# Patient Record
Sex: Female | Born: 1970 | Race: White | Hispanic: No | Smoking: Current every day smoker
Health system: Southern US, Community
[De-identification: ages and names within clinical notes are randomized; demographics above are authoritative.]

## PROBLEM LIST (undated history)

## (undated) DIAGNOSIS — T783XXA Angioneurotic edema, initial encounter: Secondary | ICD-10-CM

## (undated) DIAGNOSIS — K219 Gastro-esophageal reflux disease without esophagitis: Secondary | ICD-10-CM

## (undated) DIAGNOSIS — G43909 Migraine, unspecified, not intractable, without status migrainosus: Secondary | ICD-10-CM

## (undated) DIAGNOSIS — I5181 Takotsubo syndrome: Secondary | ICD-10-CM

## (undated) HISTORY — DX: Angioneurotic edema, initial encounter: T78.3XXA

## (undated) HISTORY — PX: APPENDECTOMY: SHX54

## (undated) HISTORY — DX: Migraine, unspecified, not intractable, without status migrainosus: G43.909

## (undated) HISTORY — DX: Takotsubo syndrome: I51.81

## (undated) HISTORY — PX: TUBAL LIGATION: SHX77

## (undated) HISTORY — DX: Gastro-esophageal reflux disease without esophagitis: K21.9

---
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
## 2011-07-18 ENCOUNTER — Encounter: Payer: Self-pay | Admitting: Urgent Care

## 2011-07-18 ENCOUNTER — Ambulatory Visit (INDEPENDENT_AMBULATORY_CARE_PROVIDER_SITE_OTHER): Payer: BC Managed Care – PPO | Admitting: Urgent Care

## 2011-07-18 VITALS — BP 132/79 | HR 71 | Temp 98.3°F | Ht 68.0 in | Wt 177.0 lb

## 2011-07-18 DIAGNOSIS — K5909 Other constipation: Secondary | ICD-10-CM | POA: Insufficient documentation

## 2011-07-18 DIAGNOSIS — K219 Gastro-esophageal reflux disease without esophagitis: Secondary | ICD-10-CM | POA: Insufficient documentation

## 2011-07-18 DIAGNOSIS — K5900 Constipation, unspecified: Secondary | ICD-10-CM

## 2011-07-18 LAB — COMPREHENSIVE METABOLIC PANEL
ALT: 12 U/L (ref 7–35)
Alkaline Phosphatase: 88 U/L
Calcium: 9.8 mg/dL
Chloride: 100 mmol/L
Potassium: 4 mmol/L
Sodium: 135 mmol/L — AB (ref 137–147)
Total Bilirubin: 0.4 mg/dL

## 2011-07-18 LAB — CBC WITH DIFFERENTIAL/PLATELET
HCT: 39 %
platelet count: 355

## 2011-07-18 LAB — LIPID PANEL: Cholesterol, Total: 218

## 2011-07-18 NOTE — Patient Instructions (Addendum)
Return the stool specimen to our office as soon as possible Miralax 17 grams daily as needed for constipation Prilosec 20mg  daily 30 minutes before breakfast for acid reflux Get a copy of your recent labs for me to review Call me with a progress report in 10-14 days Constipation in Adults Constipation is having fewer than 2 bowel movements per week. Usually, the stools are hard. As we grow older, constipation is more common. If you try to fix constipation with laxatives, the problem may get worse. This is because laxatives taken over a long period of time make the colon muscles weaker. A low-fiber diet, not taking in enough fluids, and taking some medicines may make these problems worse. MEDICATIONS THAT MAY CAUSE CONSTIPATION  Water pills (diuretics).   Calcium channel blockers (used to control blood pressure and for the heart).   Certain pain medicines (narcotics).   Anticholinergics.   Anti-inflammatory agents.   Antacids that contain aluminum.  DISEASES THAT CONTRIBUTE TO CONSTIPATION  Diabetes.   Parkinson's disease.   Dementia.   Stroke.   Depression.   Illnesses that cause problems with salt and water metabolism.  HOME CARE INSTRUCTIONS   Constipation is usually best cared for without medicines. Increasing dietary fiber and eating more fruits and vegetables is the best way to manage constipation.   Slowly increase fiber intake to 25 to 38 grams per day. Whole grains, fruits, vegetables, and legumes are good sources of fiber. A dietitian can further help you incorporate high-fiber foods into your diet.   Drink enough water and fluids to keep your urine clear or pale yellow.   A fiber supplement may be added to your diet if you cannot get enough fiber from foods.   Increasing your activities also helps improve regularity.   Suppositories, as suggested by your caregiver, will also help. If you are using antacids, such as aluminum or calcium containing products, it  will be helpful to switch to products containing magnesium if your caregiver says it is okay.   If you have been given a liquid injection (enema) today, this is only a temporary measure. It should not be relied on for treatment of longstanding (chronic) constipation.   Stronger measures, such as magnesium sulfate, should be avoided if possible. This may cause uncontrollable diarrhea. Using magnesium sulfate may not allow you time to make it to the bathroom.  SEEK IMMEDIATE MEDICAL CARE IF:   There is bright red blood in the stool.   The constipation stays for more than 4 days.   There is belly (abdominal) or rectal pain.   You do not seem to be getting better.   You have any questions or concerns.  MAKE SURE YOU:   Understand these instructions.   Will watch your condition.   Will get help right away if you are not doing well or get worse.  Document Released: 01/13/2004 Document Revised: 04/05/2011 Document Reviewed: 03/20/2011 [facility] Patient Information 2012 [place], [place]. Gastroesophageal Reflux Disease, Adult Gastroesophageal reflux disease (GERD) happens when acid from your stomach flows up into the esophagus. When acid comes in contact with the esophagus, the acid causes soreness (inflammation) in the esophagus. Over time, GERD may create small holes (ulcers) in the lining of the esophagus. CAUSES   Increased body weight. This puts pressure on the stomach, making acid rise from the stomach into the esophagus.   Smoking. This increases acid production in the stomach.   Drinking alcohol. This causes decreased pressure in the lower esophageal sphincter (  valve or ring of muscle between the esophagus and stomach), allowing acid from the stomach into the esophagus.   Late evening meals and a full stomach. This increases pressure and acid production in the stomach.   A malformed lower esophageal sphincter.  Sometimes, no cause is found. SYMPTOMS   Burning pain in the lower  part of the mid-chest behind the breastbone and in the mid-stomach area. This may occur twice a week or more often.   Trouble swallowing.   Sore throat.   Dry cough.   Asthma-like symptoms including chest tightness, shortness of breath, or wheezing.  DIAGNOSIS  Your caregiver may be able to diagnose GERD based on your symptoms. In some cases, X-rays and other tests may be done to check for complications or to check the condition of your stomach and esophagus. TREATMENT  Your caregiver may recommend over-the-counter or prescription medicines to help decrease acid production. Ask your caregiver before starting or adding any new medicines.  HOME CARE INSTRUCTIONS   Change the factors that you can control. Ask your caregiver for guidance concerning weight loss, quitting smoking, and alcohol consumption.   Avoid foods and drinks that make your symptoms worse, such as:   Caffeine or alcoholic drinks.   Chocolate.   Peppermint or mint flavorings.   Garlic and onions.   Spicy foods.   Citrus fruits, such as oranges, lemons, or limes.   Tomato-based foods such as sauce, chili, salsa, and pizza.   Fried and fatty foods.   Avoid lying down for the 3 hours prior to your bedtime or prior to taking a nap.   Eat small, frequent meals instead of large meals.   Wear loose-fitting clothing. Do not wear anything tight around your waist that causes pressure on your stomach.   Raise the head of your bed 6 to 8 inches with wood blocks to help you sleep. Extra pillows will not help.   Only take over-the-counter or prescription medicines for pain, discomfort, or fever as directed by your caregiver.   Do not take aspirin, ibuprofen, or other nonsteroidal anti-inflammatory drugs (NSAIDs).  SEEK IMMEDIATE MEDICAL CARE IF:   You have pain in your arms, neck, jaw, teeth, or back.   Your pain increases or changes in intensity or duration.   You develop nausea, vomiting, or sweating  (diaphoresis).   You develop shortness of breath, or you faint.   Your vomit is green, yellow, black, or looks like coffee grounds or blood.   Your stool is red, bloody, or black.  These symptoms could be signs of other problems, such as heart disease, gastric bleeding, or esophageal bleeding. MAKE SURE YOU:   Understand these instructions.   Will watch your condition.   Will get help right away if you are not doing well or get worse.  Document Released: 01/24/2005 Document Revised: 04/05/2011 Document Reviewed: 11/03/2010 [facility] Patient Information 2012 [place], [place].

## 2011-07-18 NOTE — Assessment & Plan Note (Addendum)
Lifelong chronic constipation.  No alarm features.  iFOBT Miralax 17 grams daily as needed for constipation Constipation literature

## 2011-07-18 NOTE — Progress Notes (Signed)
Primary Care Physician:  Dolores Hoose, OTR Primary Gastroenterologist:  Dr. Jena Gauss  Chief Complaint  Patient presents with  . Gastrophageal Reflux    HPI:  Carrie Jordan is a 41 y.o. female here as a new patient for evaluation of GERD for 5 yrs.  Started.w/ last pregnancy.  She has been having Daily heartburn.  Tried nexium 40mg  daily years ago.  Her heartburn is worse w/ lying down.  Taking TUMS, otc meds as needed, but not on a daily PPI. She also has chronic constipation and can go to a week to 2 weeks without a bowel movement.  She has been using milk of magnesia couple of days a week to help. She denies any rectal bleeding or melena. She denies any anorexia or weight loss. She denies any dysphagia or odynophagia.  She has never had an EGD or colonoscopy.  Past Medical History  Diagnosis Date  . GERD (gastroesophageal reflux disease)     Past Surgical History  Procedure Date  . Tubal ligation     No current outpatient prescriptions on file.    Allergies as of 07/18/2011  . (No Known Allergies)    Family History:There is no known family history of colorectal carcinoma , liver disease, or inflammatory bowel disease in first degree relatives.   Problem Relation Age of Onset  . Colon cancer Maternal Grandmother 60  . Cancer Paternal Grandmother     ?blood  . Thyroid disease Mother     History   Social History  . Marital Status: Married    Spouse Name: N/A    Number of Children: 2  . Years of Education: N/A   Occupational History  . cafeteria school    Social History Main Topics  . Smoking status: Former Smoker -- 1.0 packs/day    Types: Cigarettes    Quit date: 03/01/2011  . Smokeless tobacco: Former Neurosurgeon    Quit date: 03/20/2011  . Alcohol Use: No  . Drug Use: No  . Sexually Active: Not on file   Other Topics Concern  . Not on file   Social History Narrative   Lives w/ husband & 2 daughters 5&17    Review of Systems: Gen: Denies any  fever, chills, sweats, anorexia, fatigue, weakness, malaise, weight loss, and sleep disorder CV: Denies chest pain, angina, palpitations, syncope, orthopnea, PND, peripheral edema, and claudication. Resp: Denies dyspnea at rest, dyspnea with exercise, cough, sputum, wheezing, coughing up blood, and pleurisy. GI: Denies vomiting blood, jaundice, and fecal incontinence.   Denies dysphagia or odynophagia. GU : Denies urinary burning, blood in urine, urinary frequency, urinary hesitancy, nocturnal urination, and urinary incontinence. MS: Denies joint pain, limitation of movement, and swelling, stiffness, low back pain, extremity pain. Denies muscle weakness, cramps, atrophy.  Derm: Denies rash, itching, dry skin, hives, moles, warts, or unhealing ulcers.  Psych: Denies depression, anxiety, memory loss, suicidal ideation, hallucinations, paranoia, and confusion. Heme: Denies bruising, bleeding, and enlarged lymph nodes. Neuro:  Denies any headaches, dizziness, paresthesias. Endo:  Denies any problems with DM, thyroid, adrenal function.  Physical Exam: BP 132/79  Pulse 71  Temp(Src) 98.3 F (36.8 C) (Temporal)  Ht 5\' 8"  (1.727 m)  Wt 177 lb (80.287 kg)  BMI 26.91 kg/m2  LMP 06/27/2011 General:   Alert,  Well-developed, well-nourished, pleasant and cooperative in NAD Head:  Normocephalic and atraumatic. Eyes:  Sclera clear, no icterus.   Conjunctiva pink. Ears:  Normal auditory acuity. Nose:  No deformity, discharge, or lesions. Mouth:  No deformity or lesions,oropharynx pink & moist. Neck:  Supple; no masses or thyromegaly. Lungs:  Clear throughout to auscultation.   No wheezes, crackles, or rhonchi. No acute distress. Heart:  Regular rate and rhythm; no murmurs, clicks, rubs,  or gallops. Abdomen:  Normal bowel sounds.  No bruits.  Soft, non-tender and non-distended without masses, hepatosplenomegaly or hernias noted.  No guarding or rebound tenderness.   Rectal:  Deferred. Msk:   Symmetrical without gross deformities. Normal posture. Pulses:  Normal pulses noted. Extremities:  No clubbing or edema. Neurologic:  Alert and  oriented x4;  grossly normal neurologically. Skin:  Intact without significant lesions or rashes. Lymph Nodes:  No significant cervical adenopathy. Psych:  Alert and cooperative. Normal mood and affect.

## 2011-07-18 NOTE — Assessment & Plan Note (Addendum)
Carrie Jordan is a pleasant 40 y.o. female with 5 year history of chronic GERD. She is currently not taking PPI. No alarm features.  Trial of omeprazole 20 mg daily for 10-14 days If no relief with PPI, she is going to need EGD for further evaluation. She will get a copy of your recent labs for me to review GERD literature

## 2011-07-18 NOTE — Progress Notes (Signed)
Faxed to PCP

## 2011-08-06 ENCOUNTER — Telehealth: Payer: Self-pay | Admitting: Urgent Care

## 2011-08-06 NOTE — Telephone Encounter (Signed)
Please call patient to remind her to return ifobt and get a progress report Thanks

## 2011-08-07 MED ORDER — OMEPRAZOLE 20 MG PO CPDR: 20.0000 mg | DELAYED_RELEASE_CAPSULE | Freq: Every day | ORAL | Status: DC

## 2011-08-07 NOTE — Telephone Encounter (Signed)
Pt has [employer] (medical). They will cover prilosec otc #42 for 5.00. We have a rebate card for 5.00 off the Rx. Pt can get rx for free. Called pt and asked if that is something she would be interested in. Pt said yes and is requesting we [facility]. Advised pt that I would mail rebate card to her.

## 2011-08-07 NOTE — Telephone Encounter (Signed)
Tried to call pt- NA 

## 2011-08-07 NOTE — Telephone Encounter (Signed)
Noted.  Rx sent.

## 2011-08-07 NOTE — Telephone Encounter (Signed)
Called [facility] [place] and changed rx to Prilosec otc. Ok per [employer]

## 2011-08-07 NOTE — Telephone Encounter (Signed)
Pt returned called- she stated she was doing great- prilosec is helping a lot and miralax is making her have more normal bowel movements.  She mailed her ifobt, she said she had to wait until she stopped her menstrual period before she did test.  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

## 2011-08-09 NOTE — Telephone Encounter (Signed)
07/17/2011 labs reviewed CMP normal TSH normal CBC normal Total cholesterol 218

## 2011-08-13 ENCOUNTER — Ambulatory Visit (INDEPENDENT_AMBULATORY_CARE_PROVIDER_SITE_OTHER): Payer: BC Managed Care – PPO | Admitting: Urgent Care

## 2011-08-13 DIAGNOSIS — K5900 Constipation, unspecified: Secondary | ICD-10-CM

## 2011-08-14 ENCOUNTER — Telehealth: Payer: Self-pay

## 2011-08-14 NOTE — Progress Notes (Signed)
Please call patient with her noted there was no blood in her stool.

## 2011-08-14 NOTE — Progress Notes (Signed)
Pt aware.

## 2011-08-14 NOTE — Telephone Encounter (Signed)
Spoke with pt- she is requesting generic [facility] [place]. She said she could get it cheaper that way.

## 2011-08-15 MED ORDER — POLYETHYLENE GLYCOL 3350 17 GM/SCOOP PO POWD: 17.0000 g | Freq: Every day | ORAL | Status: AC

## 2011-12-17 ENCOUNTER — Other Ambulatory Visit: Payer: Self-pay | Admitting: Urgent Care

## 2011-12-17 ENCOUNTER — Other Ambulatory Visit: Payer: Self-pay

## 2012-08-04 ENCOUNTER — Encounter: Payer: Self-pay | Admitting: Family Medicine

## 2012-08-04 ENCOUNTER — Ambulatory Visit (INDEPENDENT_AMBULATORY_CARE_PROVIDER_SITE_OTHER): Payer: BC Managed Care – PPO | Admitting: Family Medicine

## 2012-08-04 VITALS — BP 135/78 | HR 80 | Temp 98.0°F | Ht 68.0 in | Wt 182.8 lb

## 2012-08-04 DIAGNOSIS — J029 Acute pharyngitis, unspecified: Secondary | ICD-10-CM | POA: Insufficient documentation

## 2012-08-04 DIAGNOSIS — L255 Unspecified contact dermatitis due to plants, except food: Secondary | ICD-10-CM | POA: Insufficient documentation

## 2012-08-04 LAB — POCT RAPID STREP A (OFFICE): Rapid Strep A Screen: NEGATIVE

## 2012-08-04 MED ORDER — TRIAMCINOLONE ACETONIDE 0.5 % EX OINT: TOPICAL_OINTMENT | Freq: Two times a day (BID) | CUTANEOUS | Status: DC

## 2012-08-04 MED ORDER — CETIRIZINE HCL 10 MG PO TABS: 10.0000 mg | ORAL_TABLET | Freq: Every day | ORAL | Status: DC

## 2012-08-04 NOTE — Progress Notes (Signed)
Patient ID: Carrie Jordan, female   DOB: [birth year], 42 y.o.   MRN: [id number] SUBJECTIVE:  HPI: Sorethroat last couple of days. Mild congestion. unsure whether it is due to URI , strep or allergies. Works in the school system. Also, planting flowers and cleaning up the yard and got into poison oak. Has a rash breaking out on the forearms, etc.   PMH/PSH: reviewed/updated in Epic  SH/FH: reviewed/updated in Epic  Allergies: reviewed/updated in Epic  Medications: reviewed/updated in Epic  Immunizations: reviewed/updated in Epic   ROS: As above in the HPI. All other systems are stable or negative.   OBJECTIVE: APPEARANCE:  Patient in no acute distress.The patient appeared well nourished and normally developed. Acyanotic.  Waist:  VITAL SIGNS:  SKIN: warm and  Dry without  tattoos and scars. Rash : moist  Red, maculopapular on the forearms. Significant eruption. Looks like a contact dermatitis.  HEAD and Neck: without JVD, throat hyperemic. Mild nasal congestion. No scleral icterus  CHEST & LUNGS: Clear  CVS: Reveals the PMI to be normally located. Regular rhythm, First and Second Heart sounds are normal, and absence of murmurs, rubs or gallops.  ABDOMEN:  Benign,, no organomegaly, no masses, no Abdominal Aortic enlargement. No Guarding , no rebound. No Bruits.  RECTAL:n/a  GU:n/a  EXTREMETIES: nonedematous. Both Femoral and Pedal pulses are normal.  MUSCULOSKELETAL:  Spine: normal Joints: intact  NEUROLOGIC: oriented to time,place and person; nonfocal. Strength is normal Sensory is normal Reflexes are normal Cranial Nerves are normal.    ASSESSMENT: Sore throat - Plan: POCT rapid strep A  Pharyngitis  Contact dermatitis and other eczema due to plants (except food)     PLAN: Orders Placed This Encounter  Procedures  . POCT rapid strep A   Results for orders placed in visit on 08/04/12 (from the past 24 hour(s))  POCT RAPID STREP A (OFFICE)      Status: Normal   Collection Time    08/04/12  4:48 PM      Result Value Range   Rapid Strep A Screen Negative  Negative                                   Meds ordered this encounter  Medications  . cetirizine (ZYRTEC) 10 MG tablet    Sig: Take 1 tablet (10 mg total) by mouth daily.    Dispense:  30 tablet    Refill:  5  . triamcinolone ointment (KENALOG) 0.5 %    Sig: Apply topically 2 (two) times daily.    Dispense:  60 g    Refill:  0   Opted to treat topically instead of with systemic steroids due to the coexisting URI.  Disa Riedlinger P. Modesto Charon, M.D.

## 2013-01-23 ENCOUNTER — Other Ambulatory Visit: Payer: Self-pay | Admitting: Gastroenterology

## 2013-01-23 ENCOUNTER — Other Ambulatory Visit: Payer: Self-pay | Admitting: Family Medicine

## 2013-07-20 ENCOUNTER — Encounter: Payer: Self-pay | Admitting: General Practice

## 2013-07-20 ENCOUNTER — Ambulatory Visit (INDEPENDENT_AMBULATORY_CARE_PROVIDER_SITE_OTHER): Payer: BC Managed Care – PPO | Admitting: General Practice

## 2013-07-20 VITALS — BP 140/77 | HR 83 | Temp 97.2°F | Ht 67.0 in | Wt 187.4 lb

## 2013-07-20 DIAGNOSIS — J309 Allergic rhinitis, unspecified: Secondary | ICD-10-CM

## 2013-07-20 DIAGNOSIS — Z23 Encounter for immunization: Secondary | ICD-10-CM

## 2013-07-20 DIAGNOSIS — K219 Gastro-esophageal reflux disease without esophagitis: Secondary | ICD-10-CM

## 2013-07-20 DIAGNOSIS — Z0000 Encounter for general adult medical examination without abnormal findings: Secondary | ICD-10-CM

## 2013-07-20 MED ORDER — OMEPRAZOLE 20 MG PO CPDR: 20.0000 mg | DELAYED_RELEASE_CAPSULE | Freq: Every day | ORAL | Status: DC

## 2013-07-20 MED ORDER — CETIRIZINE HCL 10 MG PO TABS: 10.0000 mg | ORAL_TABLET | Freq: Every day | ORAL | Status: DC

## 2013-07-20 NOTE — Progress Notes (Signed)
   Subjective:    Patient ID: Carrie Jordan, female    DOB: 1971/04/24, 43 y.o.   MRN: [id number]  HPI Patient presents today for annual exam. Lab work was drawn on 06/30/13 at her place of work. History of gerd and allergic rhinitis. Taking prilosec and zyrtec. Denies regular exercise, but eats healthy.     Review of Systems  Constitutional: Negative for fever and chills.  Respiratory: Negative for chest tightness and shortness of breath.   Cardiovascular: Negative for chest pain and palpitations.  Neurological: Negative for dizziness, weakness and headaches.  All other systems reviewed and are negative.       Objective:   Physical Exam  Constitutional: She is oriented to person, place, and time. She appears well-developed and well-nourished.  HENT:  Head: Normocephalic and atraumatic.  Right Ear: External ear normal.  Left Ear: External ear normal.  Mouth/Throat: Oropharynx is clear and moist.  Eyes: EOM are normal. Pupils are equal, round, and reactive to light.  Neck: Normal range of motion. Neck supple. No thyromegaly present.  Cardiovascular: Normal rate, regular rhythm and normal heart sounds.   Pulmonary/Chest: Effort normal and breath sounds normal. No respiratory distress. She exhibits no tenderness.  Abdominal: Soft. Bowel sounds are normal. She exhibits no distension. There is no tenderness.  Lymphadenopathy:    She has no cervical adenopathy.  Neurological: She is alert and oriented to person, place, and time.  Skin: Skin is warm and dry.  Psychiatric: She has a normal mood and affect.          Assessment & Plan:  1. Annual physical exam   2. Need for diphtheria-tetanus-pertussis (Tdap) vaccine, adult/adolescent  - Tdap vaccine greater than or equal to 7yo IM  3. GERD (gastroesophageal reflux disease)  - omeprazole (PRILOSEC) 20 MG capsule; Take 1 capsule (20 mg total) by mouth daily.  Dispense: 30 capsule; Refill: 11  4. Allergic rhinitis  -  cetirizine (ZYRTEC) 10 MG tablet; Take 1 tablet (10 mg total) by mouth daily.  Dispense: 30 tablet; Refill: 11 -Continue all current medications Labs to be scanned in  Reviewed labs with patient that were drawn at work. Most wnl, cholesterol elevated F/u in 6 months Discussed benefits of regular exercise and healthy eating (low fat, baked foods, increase fiber) Patient verbalized understanding Coralie Keens, FNP-C

## 2013-07-20 NOTE — Patient Instructions (Addendum)
Tetanus, Diphtheria, Pertussis (Tdap) Vaccine What You Need to Know WHY GET VACCINATED? Tetanus, diphtheria and pertussis can be very serious diseases, even for adolescents and adults. Tdap vaccine can protect [place] from these diseases. TETANUS (Lockjaw) causes painful muscle tightening and stiffness, usually all over the body.  It can lead to tightening of muscles in the head and neck so you can't open your mouth, swallow, or sometimes even breathe. Tetanus kills about 1 out of 5 people who are infected. DIPHTHERIA can cause a thick coating to form in the back of the throat.  It can lead to breathing problems, paralysis, heart failure, and death. PERTUSSIS (Whooping Cough) causes severe coughing spells, which can cause difficulty breathing, vomiting and disturbed sleep.  It can also lead to weight loss, incontinence, and rib fractures. Up to 2 in 100 adolescents and 5 in 100 adults with pertussis are hospitalized or have complications, which could include pneumonia and death. These diseases are caused by bacteria. Diphtheria and pertussis are spread from person to person through coughing or sneezing. Tetanus enters the body through cuts, scratches, or wounds. Before vaccines, the [place] saw as many as 200,000 cases a year of diphtheria and pertussis, and hundreds of cases of tetanus. Since vaccination began, tetanus and diphtheria have dropped by about 99% and pertussis by about 80%. TDAP VACCINE Tdap vaccine can protect adolescents and adults from tetanus, diphtheria, and pertussis. One dose of Tdap is routinely given at age 36 or 66. People who did not get Tdap at that age should get it as soon as possible. Tdap is especially important for health care professionals and anyone having close contact with a baby younger than 12 months. Pregnant women should get a dose of Tdap during every pregnancy, to protect the newborn from pertussis. Infants are most at risk for severe, life-threatening  complications from pertussis. A similar vaccine, called Td, protects from tetanus and diphtheria, but not pertussis. A Td booster should be given every 10 years. Tdap may be given as one of these boosters if you have not already gotten a dose. Tdap may also be given after a severe cut or burn to prevent tetanus infection. Your doctor can give you more information. Tdap may safely be given at the same time as other vaccines. SOME PEOPLE SHOULD NOT GET THIS VACCINE  If you ever had a life-threatening allergic reaction after a dose of any tetanus, diphtheria, or pertussis containing vaccine, OR if you have a severe allergy to any part of this vaccine, you should not get Tdap. Tell your doctor if you have any severe allergies.  If you had a coma, or long or multiple seizures within 7 days after a childhood dose of DTP or DTaP, you should not get Tdap, unless a cause other than the vaccine was found. You can still get Td.  Talk to your doctor if you:  have epilepsy or another nervous system problem,  had severe pain or swelling after any vaccine containing diphtheria, tetanus or pertussis,  ever had Guillain-Barr Syndrome (GBS),  aren't feeling well on the day the shot is scheduled. RISKS OF A VACCINE REACTION With any medicine, including vaccines, there is a chance of side effects. These are usually mild and go away on their own, but serious reactions are also possible. Brief fainting spells can follow a vaccination, leading to injuries from falling. Sitting or lying down for about 15 minutes can help prevent these. Tell your doctor if you feel dizzy or light-headed, or  have vision changes or ringing in the ears. Mild problems following Tdap (Did not interfere with activities)  Pain where the shot was given (about 3 in 4 adolescents or 2 in 3 adults)  Redness or swelling where the shot was given (about 1 person in 5)  Mild fever of at least 100.85F (up to about 1 in 25 adolescents or 1 in  100 adults)  Headache (about 3 or 4 people in 10)  Tiredness (about 1 person in 3 or 4)  Nausea, vomiting, diarrhea, stomach ache (up to 1 in 4 adolescents or 1 in 10 adults)  Chills, body aches, sore joints, rash, swollen glands (uncommon) Moderate problems following Tdap (Interfered with activities, but did not require medical attention)  Pain where the shot was given (about 1 in 5 adolescents or 1 in 100 adults)  Redness or swelling where the shot was given (up to about 1 in 16 adolescents or 1 in 25 adults)  Fever over 102F (about 1 in 100 adolescents or 1 in 250 adults)  Headache (about 3 in 20 adolescents or 1 in 10 adults)  Nausea, vomiting, diarrhea, stomach ache (up to 1 or 3 people in 100)  Swelling of the entire arm where the shot was given (up to about 3 in 100). Severe problems following Tdap (Unable to perform usual activities, required medical attention)  Swelling, severe pain, bleeding and redness in the arm where the shot was given (rare). A severe allergic reaction could occur after any vaccine (estimated less than 1 in a million doses). WHAT IF THERE IS A SERIOUS REACTION? What should I look for?  Look for anything that concerns you, such as signs of a severe allergic reaction, very high fever, or behavior changes. Signs of a severe allergic reaction can include hives, swelling of the face and throat, difficulty breathing, a fast heartbeat, dizziness, and weakness. These would start a few minutes to a few hours after the vaccination. What should I do?  If you think it is a severe allergic reaction or other emergency that can't wait, call 9-1-1 or get the person to the nearest hospital. Otherwise, call your doctor.  Afterward, the reaction should be reported to the "Vaccine Adverse Event Reporting System" (VAERS). Your doctor might file this report, or you can do it yourself through the VAERS web site at www.vaers.SamedayNews.es, or by calling 228-541-5957. VAERS is  only for reporting reactions. They do not give medical advice.  THE NATIONAL VACCINE INJURY COMPENSATION PROGRAM The National Vaccine Injury Compensation Program (VICP) is a federal program that was created to compensate people who may have been injured by certain vaccines. Persons who believe they may have been injured by a vaccine can learn about the program and about filing a claim by calling (279) 754-6919 or visiting the [employer] website at GoldCloset.com.ee. HOW CAN I LEARN MORE?  Ask your doctor.  Call your local or state health department.  Contact the Centers for Disease Control and Prevention (CDC):  Call 856-316-9983 or visit CDC's website at http://hunter.com/. CDC Tdap Vaccine VIS (09/06/11) Document Released: 10/16/2011 Document Revised: 08/11/2012 Document Reviewed: 08/06/2012 [facility] Patient Information 2014 [place], [place].   Exercise to Stay Healthy Exercise helps you become and stay healthy. EXERCISE IDEAS AND TIPS Choose exercises that:  You enjoy.  Fit into your day. You do not need to exercise really hard to be healthy. You can do exercises at a slow or medium level and stay healthy. You can:  Stretch before and after working out.  Try yoga, Pilates, or tai chi.  Lift weights.  Walk fast, swim, jog, run, climb stairs, bicycle, dance, or rollerskate.  Take aerobic classes. Exercises that burn about 150 calories:  Running 1  miles in 15 minutes.  Playing volleyball for 45 to 60 minutes.  Washing and waxing a car for 45 to 60 minutes.  Playing touch football for 45 minutes.  Walking 1  miles in 35 minutes.  Pushing a stroller 1  miles in 30 minutes.  Playing basketball for 30 minutes.  Raking leaves for 30 minutes.  Bicycling 5 miles in 30 minutes.  Walking 2 miles in 30 minutes.  Dancing for 30 minutes.  Shoveling snow for 15 minutes.  Swimming laps for 20 minutes.  Walking up stairs for 15 minutes.  Bicycling 4  miles in 15 minutes.  Gardening for 30 to 45 minutes.  Jumping rope for 15 minutes.  Washing windows or floors for 45 to 60 minutes. Document Released: 05/19/2010 Document Revised: 07/09/2011 Document Reviewed: 05/19/2010 [facility] Patient Information 2014 [place], [place].

## 2014-01-25 ENCOUNTER — Encounter: Payer: Self-pay | Admitting: Family

## 2014-01-25 ENCOUNTER — Ambulatory Visit (INDEPENDENT_AMBULATORY_CARE_PROVIDER_SITE_OTHER): Payer: BC Managed Care – PPO | Admitting: Family

## 2014-01-25 VITALS — BP 149/89 | HR 71 | Temp 97.9°F | Ht 67.0 in | Wt 189.8 lb

## 2014-01-25 DIAGNOSIS — J069 Acute upper respiratory infection, unspecified: Secondary | ICD-10-CM

## 2014-01-25 MED ORDER — AZITHROMYCIN 250 MG PO TABS: ORAL_TABLET | ORAL | Status: DC

## 2014-01-25 NOTE — Progress Notes (Signed)
   Subjective:    Patient ID: Carrie Jordan, female    DOB: November 08, 1970, 43 y.o.   MRN: [id number]  HPI Pt presents to the office for a "lump in her throat" that started the last couple of days. Pt denies any pain or fever. PT states that when she swallows she feels like there is "something hanging that wont' go away".    Review of Systems  Constitutional: Negative.  Negative for fever and fatigue.  HENT: Negative.  Negative for ear discharge, ear pain, postnasal drip, rhinorrhea, sinus pressure and sneezing.   Eyes: Negative.   Respiratory: Negative.  Negative for shortness of breath.   Cardiovascular: Negative.  Negative for palpitations.  Gastrointestinal: Negative.   Endocrine: Negative.   Genitourinary: Negative.   Musculoskeletal: Negative.   Neurological: Negative.  Negative for headaches.  Hematological: Negative.   Psychiatric/Behavioral: Negative.   All other systems reviewed and are negative.      Objective:   Physical Exam  Vitals reviewed. Constitutional: She is oriented to person, place, and time. She appears well-developed and well-nourished. No distress.  HENT:  Head: Normocephalic and atraumatic.  Right Ear: External ear normal.  Left Ear: External ear normal.  Nasal passage erythemas with mild swelling Oropharynx erythemas  Eyes: Pupils are equal, round, and reactive to light.  Neck: Normal range of motion. Neck supple. No thyromegaly present.  Cardiovascular: Normal rate, regular rhythm, normal heart sounds and intact distal pulses.   No murmur heard. Pulmonary/Chest: Effort normal and breath sounds normal. No respiratory distress. She has no wheezes.  Abdominal: Soft. Bowel sounds are normal. She exhibits no distension. There is no tenderness.  Musculoskeletal: Normal range of motion. She exhibits no edema and no tenderness.  Neurological: She is alert and oriented to person, place, and time. She has normal reflexes. No cranial nerve deficit.  Skin: Skin  is warm and dry.  Psychiatric: She has a normal mood and affect. Her behavior is normal. Judgment and thought content normal.    BP 149/89  Pulse 71  Temp(Src) 97.9 F (36.6 C) (Oral)  Ht 5\' 7"  (1.702 m)  Wt 189 lb 12.8 oz (86.093 kg)  BMI 29.72 kg/m2       Assessment & Plan:  1. URI (upper respiratory infection) -- Take meds as prescribed - Use a cool mist humidifier  -Use saline nose sprays frequently -Saline irrigations of the nose can be very helpful if done frequently.  * 4X daily for 1 week*  * Use of a nettie pot can be helpful with this. Follow directions with this* -Force fluids -For any cough or congestion  Use plain Mucinex- regular strength or max strength is fine   * Children- consult with Pharmacist for dosing -For fever or aces or pains- take tylenol or ibuprofen appropriate for age and weight.  * for fevers greater than 101 orally you may alternate ibuprofen and tylenol every  3 hours. -Throat lozenges if help - azithromycin (ZITHROMAX) 250 MG tablet; Take 500 mg once then 250 mg for next 4 days  Dispense: 6 each; Refill: 0  Jannifer Rodney, FNP

## 2014-01-25 NOTE — Patient Instructions (Signed)
Upper Respiratory Infection, Adult An upper respiratory infection (URI) is also known as the common cold. It is often caused by a type of germ (virus). Colds are easily spread (contagious). You can pass it to others by kissing, coughing, sneezing, or drinking out of the same glass. Usually, you get better in 1 or 2 weeks.  HOME CARE   Only take medicine as told by your doctor.  Use a warm mist humidifier or breathe in steam from a hot shower.  Drink enough water and fluids to keep your pee (urine) clear or pale yellow.  Get plenty of rest.  Return to work when your temperature is back to normal or as told by your doctor. You may use a face mask and wash your hands to stop your cold from spreading. GET HELP RIGHT AWAY IF:   After the first few days, you feel you are getting worse.  You have questions about your medicine.  You have chills, shortness of breath, or brown or red spit (mucus).  You have yellow or brown snot (nasal discharge) or pain in the face, especially when you bend forward.  You have a fever, puffy (swollen) neck, pain when you swallow, or white spots in the back of your throat.  You have a bad headache, ear pain, sinus pain, or chest pain.  You have a high-pitched whistling sound when you breathe in and out (wheezing).  You have a lasting cough or cough up blood.  You have sore muscles or a stiff neck. MAKE SURE YOU:   Understand these instructions.  Will watch your condition.  Will get help right away if you are not doing well or get worse. Document Released: 10/03/2007 Document Revised: 07/09/2011 Document Reviewed: 07/22/2013 [employer] Patient Information 2015 [employer]. This information is not intended to replace advice given to you by your health care provider. Make sure you discuss any questions you have with your health care provider.  \ - Take meds as prescribed - Use a cool mist humidifier  -Use saline nose sprays frequently -Saline  irrigations of the nose can be very helpful if done frequently.  * 4X daily for 1 week*  * Use of a nettie pot can be helpful with this. Follow directions with this* -Force fluids -For any cough or congestion  Use plain Mucinex- regular strength or max strength is fine   * Children- consult with Pharmacist for dosing -For fever or aces or pains- take tylenol or ibuprofen appropriate for age and weight.  * for fevers greater than 101 orally you may alternate ibuprofen and tylenol every  3 hours. -Throat lozenges if help   Kentrell Hallahan, FNP   

## 2014-02-25 ENCOUNTER — Telehealth: Payer: Self-pay | Admitting: Family

## 2014-02-25 NOTE — Telephone Encounter (Signed)
appt given for tomorrow at 4:45 with Annette Stable

## 2014-02-26 ENCOUNTER — Ambulatory Visit (INDEPENDENT_AMBULATORY_CARE_PROVIDER_SITE_OTHER): Payer: BC Managed Care – PPO | Admitting: Family Medicine

## 2014-02-26 ENCOUNTER — Encounter: Payer: Self-pay | Admitting: Family Medicine

## 2014-02-26 VITALS — BP 150/83 | HR 86 | Temp 97.1°F | Ht 67.0 in | Wt 189.8 lb

## 2014-02-26 DIAGNOSIS — R1013 Epigastric pain: Secondary | ICD-10-CM

## 2014-02-26 DIAGNOSIS — H8113 Benign paroxysmal vertigo, bilateral: Secondary | ICD-10-CM

## 2014-02-26 LAB — POCT CBC
Granulocyte percent: 64.1 %G (ref 37–80)
HCT, POC: 34.1 % — AB (ref 37.7–47.9)
Hemoglobin: 11.4 g/dL — AB (ref 12.2–16.2)
Lymph, poc: 3.2 (ref 0.6–3.4)
MCH, POC: 28.2 pg (ref 27–31.2)
MCHC: 33.4 g/dL (ref 31.8–35.4)
MCV: 84.5 fL (ref 80–97)
MPV: 7.3 fL (ref 0–99.8)
POC Granulocyte: 6.7 (ref 2–6.9)
POC LYMPH PERCENT: 30.4 %L (ref 10–50)
Platelet Count, POC: 325 10*3/uL (ref 142–424)
RBC: 4 M/uL — AB (ref 4.04–5.48)
RDW, POC: 14.8 %
WBC: 10.4 10*3/uL — AB (ref 4.6–10.2)

## 2014-02-26 MED ORDER — OMEPRAZOLE 40 MG PO CPDR: 40.0000 mg | DELAYED_RELEASE_CAPSULE | Freq: Every day | ORAL | Status: DC

## 2014-02-26 MED ORDER — MECLIZINE HCL 25 MG PO TABS: 25.0000 mg | ORAL_TABLET | Freq: Three times a day (TID) | ORAL | Status: DC | PRN

## 2014-02-26 NOTE — Progress Notes (Signed)
   Subjective:    Patient ID: Carrie Jordan, female    DOB: 1970/10/23, 43 y.o.   MRN: [id number]  HPI C/o epigastric abdominal pain for a week.  She denies nausea at present. She also c/o vertigo  Review of Systems  Constitutional: Negative for fever.  HENT: Negative for ear pain.   Eyes: Negative for discharge.  Respiratory: Negative for cough.   Cardiovascular: Negative for chest pain.  Gastrointestinal: Positive for abdominal pain and abdominal distention.  Endocrine: Negative for polyuria.  Genitourinary: Negative for difficulty urinating.  Musculoskeletal: Negative for gait problem and neck pain.  Skin: Negative for color change and rash.  Neurological: Positive for dizziness. Negative for speech difficulty and headaches.  Psychiatric/Behavioral: Negative for agitation.       Objective:    BP 150/83  Pulse 86  Temp(Src) 97.1 F (36.2 C) (Oral)  Ht _0  (1.702 m)  Wt 189 lb 12.8 oz (86.093 kg)  BMI 29.72 kg/m2  LMP 02/04/2014 Physical Exam  Constitutional: She is oriented to person, place, and time. She appears well-developed and well-nourished.  HENT:  Head: Normocephalic and atraumatic.  Mouth/Throat: Oropharynx is clear and moist.  Eyes: Pupils are equal, round, and reactive to light.  Neck: Normal range of motion. Neck supple.  Cardiovascular: Normal rate and regular rhythm.   No murmur heard. Pulmonary/Chest: Effort normal and breath sounds normal.  Abdominal: Soft. Bowel sounds are normal. There is tenderness.  Neurological: She is alert and oriented to person, place, and time.  Skin: Skin is warm and dry.  Psychiatric: She has a normal mood and affect.          Assessment & Plan:     ICD-9-CM ICD-10-CM   1. Epigastric pain 789.06 R10.13 POCT CBC     CMP14+EGFR     Lipase     Amylase  2. Benign paroxysmal positional vertigo, bilateral 386.11 H81.13 meclizine (ANTIVERT) 25 MG tablet   GI cocktail with good results and resolution of pain and  explained that she probably has gastritis and increasing prilosec 74m po qd will help resolve.    Return if symptoms worsen or fail to improve.  WLysbeth PennerFNP

## 2014-02-27 LAB — CMP14+EGFR
ALT: 9 IU/L (ref 0–32)
AST: 14 IU/L (ref 0–40)
Albumin/Globulin Ratio: 1.5 (ref 1.1–2.5)
Albumin: 4.4 g/dL (ref 3.5–5.5)
Alkaline Phosphatase: 91 IU/L (ref 39–117)
BUN/Creatinine Ratio: 17 (ref 9–23)
BUN: 13 mg/dL (ref 6–24)
CO2: 24 mmol/L (ref 18–29)
Calcium: 9.5 mg/dL (ref 8.7–10.2)
Chloride: 100 mmol/L (ref 97–108)
Creatinine, Ser: 0.77 mg/dL (ref 0.57–1.00)
GFR calc Af Amer: 108 mL/min/{1.73_m2} (ref >59)
GFR calc non Af Amer: 94 mL/min/{1.73_m2} (ref >59)
Globulin, Total: 2.9 g/dL (ref 1.5–4.5)
Glucose: 91 mg/dL (ref 65–99)
Potassium: 4.2 mmol/L (ref 3.5–5.2)
Sodium: 139 mmol/L (ref 134–144)
Total Bilirubin: 0.2 mg/dL (ref 0.0–1.2)
Total Protein: 7.3 g/dL (ref 6.0–8.5)

## 2014-02-27 LAB — LIPASE: Lipase: 17 U/L (ref 0–59)

## 2014-02-27 LAB — AMYLASE: Amylase: 44 U/L (ref 31–124)

## 2014-03-01 ENCOUNTER — Telehealth: Payer: Self-pay | Admitting: Family Medicine

## 2014-03-01 NOTE — Telephone Encounter (Signed)
Labs not back yet

## 2014-03-03 ENCOUNTER — Telehealth: Payer: Self-pay | Admitting: Family Medicine

## 2014-03-03 NOTE — Telephone Encounter (Signed)
done

## 2014-03-04 LAB — IRON AND TIBC
Iron Saturation: 11 % — ABNORMAL LOW (ref 15–55)
Iron: 44 ug/dL (ref 35–155)
TIBC: 397 ug/dL (ref 250–450)
UIBC: 353 ug/dL (ref 150–375)

## 2014-03-04 LAB — FERRITIN: Ferritin: 14 ng/mL — ABNORMAL LOW (ref 15–150)

## 2014-03-04 LAB — SPECIMEN STATUS REPORT

## 2014-03-04 LAB — B12 AND FOLATE PANEL
Folate: 12.8 ng/mL (ref >3.0)
Vitamin B-12: 352 pg/mL (ref 211–946)

## 2014-03-05 ENCOUNTER — Other Ambulatory Visit: Payer: Self-pay | Admitting: Family Medicine

## 2014-03-05 MED ORDER — FERROUS SULFATE 325 (65 FE) MG PO TBEC: DELAYED_RELEASE_TABLET | ORAL | Status: DC

## 2014-03-05 NOTE — Telephone Encounter (Signed)
called about labs - aware of anemia   What can she do about the headaches ( she mentioned HA's at visit, but it didn't really get addressed with her other issues) Tried otc- everything- nothing is helping  She is pretty sure these are migraines

## 2014-07-06 ENCOUNTER — Other Ambulatory Visit: Payer: Self-pay | Admitting: Family Medicine

## 2014-08-10 ENCOUNTER — Other Ambulatory Visit: Payer: Self-pay | Admitting: General Practice

## 2014-09-10 ENCOUNTER — Other Ambulatory Visit: Payer: Self-pay | Admitting: Family

## 2014-10-11 ENCOUNTER — Other Ambulatory Visit: Payer: Self-pay | Admitting: Family Medicine

## 2014-10-11 ENCOUNTER — Other Ambulatory Visit: Payer: Self-pay | Admitting: Family

## 2014-10-27 ENCOUNTER — Encounter: Payer: Self-pay | Admitting: Family Medicine

## 2014-10-28 LAB — HM MAMMOGRAPHY: HM Mammogram: NEGATIVE

## 2014-11-15 ENCOUNTER — Other Ambulatory Visit: Payer: Self-pay | Admitting: Family Medicine

## 2015-02-21 ENCOUNTER — Encounter: Payer: Self-pay | Admitting: Pediatrics

## 2015-02-21 ENCOUNTER — Ambulatory Visit (INDEPENDENT_AMBULATORY_CARE_PROVIDER_SITE_OTHER): Payer: BC Managed Care – PPO | Admitting: Pediatrics

## 2015-02-21 VITALS — BP 145/85 | HR 88 | Temp 97.4°F | Ht 67.0 in | Wt 193.4 lb

## 2015-02-21 DIAGNOSIS — J019 Acute sinusitis, unspecified: Secondary | ICD-10-CM

## 2015-02-21 DIAGNOSIS — J309 Allergic rhinitis, unspecified: Secondary | ICD-10-CM | POA: Diagnosis not present

## 2015-02-21 DIAGNOSIS — R05 Cough: Secondary | ICD-10-CM | POA: Diagnosis not present

## 2015-02-21 DIAGNOSIS — R059 Cough, unspecified: Secondary | ICD-10-CM

## 2015-02-21 MED ORDER — AMOXICILLIN-POT CLAVULANATE 875-125 MG PO TABS: 1.0000 | ORAL_TABLET | Freq: Two times a day (BID) | ORAL | Status: DC

## 2015-02-21 MED ORDER — LEVOCETIRIZINE DIHYDROCHLORIDE 5 MG PO TABS: 5.0000 mg | ORAL_TABLET | Freq: Every evening | ORAL | Status: DC

## 2015-02-21 MED ORDER — BENZONATATE 200 MG PO CAPS: 200.0000 mg | ORAL_CAPSULE | Freq: Two times a day (BID) | ORAL | Status: DC | PRN

## 2015-02-21 NOTE — Progress Notes (Signed)
    Subjective:    Patient ID: Carrie Jordan, female    DOB: 13-Mar-1971, 44 y.o.   MRN: [id number]  CC: sinus pressure  HPI: Carrie Jordan is a 44 y.o. female presenting on 02/21/2015 for Nasal Congestion; Headache; Cough; and Sore Throat  Started with sore throat 7 days ago. Gargled salt water 3 days had lots of nasal congestion, headache.  Now yellow nasal discharge Lots of facial pain Some subjective fevers over past 2 days. Joints achey. Nyquil did not help Alkaseltzer cold  Ibuprofen helped achiness Still coughing, worse at night BP usually runs low  Relevant past medical, surgical, family and social history reviewed and updated as indicated. Interim medical history since our last visit reviewed. Allergies and medications reviewed and updated.  ROS: Per HPI unless specifically indicated above  Past Medical History Patient Active Problem List   Diagnosis Date Noted  . Allergic rhinitis 07/20/2013  . Pharyngitis 08/04/2012  . 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 Constipation, chronic 07/18/2011  . GERD (gastroesophageal reflux disease) 07/18/2011    Current Outpatient Prescriptions  Medication Sig Dispense Refill  . omeprazole (PRILOSEC) 20 MG capsule Take 1 capsule (20 mg total) by mouth daily. 30 capsule 11  . amoxicillin-clavulanate (AUGMENTIN) 875-125 MG tablet Take 1 tablet by mouth 2 (two) times daily. 20 tablet 0  . levocetirizine (XYZAL) 5 MG tablet Take 1 tablet (5 mg total) by mouth every evening. 30 tablet 11   No current facility-administered medications for this visit.       Objective:    BP 145/85 mmHg  Pulse 88  Temp(Src) 97.4 F (36.3 C) (Oral)  Ht 5\' 7"  (1.702 m)  Wt 193 lb 6.4 oz (87.726 kg)  BMI 30.28 kg/m2  Wt Readings from Last 3 Encounters:  02/21/15 193 lb 6.4 oz (87.726 kg)  02/26/14 189 lb 12.8 oz (86.093 kg)  01/25/14 189 lb 12.8 oz (86.093 kg)    Gen: NAD, alert, cooperative with exam,  NCAT EYES: EOMI, no scleral injection or icterus ENT:  L TM bulging, clear fluid, R TM mildly erythematous, OP with mild erythema, tenderness over max and frontal sinuses LYMPH: no cervical LAD CV: NRRR, normal S1/S2, no murmur, WWP Resp: CTABL, no wheezes, normal WOB Neuro: Alert and oriented     Assessment & Plan:   Mitsue was seen today for acute sinusitis and allergic rhinitis. Some fevers over the weekend. Increase sinus drainage. Tx with antibiotics, netipot, symptomatic care.  Diagnoses and all orders for this visit:  Acute sinusitis, recurrence not specified, unspecified location -     amoxicillin-clavulanate (AUGMENTIN) 875-125 MG tablet; Take 1 tablet by mouth 2 (two) times daily.  Allergic rhinitis, unspecified allergic rhinitis type -     levocetirizine (XYZAL) 5 MG tablet; Take 1 tablet (5 mg total) by mouth every evening.   Elevated BP Return for CPE in 4 weeks. Says usually BP is better.  Follow up plan: Return in about 4 weeks (around 03/21/2015) for CPE.  Rex Kras, MD [facility] 02/21/2015, 3:21 PM

## 2015-02-21 NOTE — Patient Instructions (Addendum)
Netipot twice a day  Ibuprofen as needed flonase

## 2015-02-21 NOTE — Addendum Note (Signed)
Addended by: Johna Sheriff on: 02/21/2015 03:38 PM   Modules accepted: Orders, SmartSet

## 2015-07-15 ENCOUNTER — Encounter: Payer: Self-pay | Admitting: Family Medicine

## 2015-07-15 ENCOUNTER — Ambulatory Visit (INDEPENDENT_AMBULATORY_CARE_PROVIDER_SITE_OTHER): Payer: BC Managed Care – PPO | Admitting: Family Medicine

## 2015-07-15 VITALS — BP 132/82 | HR 78 | Temp 98.4°F | Ht 67.0 in | Wt 193.4 lb

## 2015-07-15 DIAGNOSIS — R5383 Other fatigue: Secondary | ICD-10-CM | POA: Diagnosis not present

## 2015-07-15 DIAGNOSIS — Z13220 Encounter for screening for lipoid disorders: Secondary | ICD-10-CM

## 2015-07-15 DIAGNOSIS — Z114 Encounter for screening for human immunodeficiency virus [HIV]: Secondary | ICD-10-CM | POA: Diagnosis not present

## 2015-07-15 DIAGNOSIS — J309 Allergic rhinitis, unspecified: Secondary | ICD-10-CM

## 2015-07-15 DIAGNOSIS — R635 Abnormal weight gain: Secondary | ICD-10-CM

## 2015-07-15 MED ORDER — LEVOCETIRIZINE DIHYDROCHLORIDE 5 MG PO TABS: 5.0000 mg | ORAL_TABLET | Freq: Every evening | ORAL | Status: DC

## 2015-07-15 NOTE — Progress Notes (Signed)
BP 132/82 mmHg  Pulse 78  Temp(Src) 98.4 F (36.9 C) (Oral)  Ht _0  (1.702 m)  Wt 193 lb 6.4 oz (87.726 kg)  BMI 30.28 kg/m2   Subjective:    Patient ID: Carrie Jordan, female    DOB: February 19, 1971, 45 y.o.   MRN: [id number]  HPI: Carrie Jordan is a 45 y.o. female presenting on 07/15/2015 for Weight gain, decreased energy, chronic constipation; would l   HPI Weight gain and decreased energy and constipation Patient is concerned because she has been having weight gain and decreased energy and chronic constipation that has been bothering her over the past couple years. She feels like it is been increasing more recently over the past few months. She does have a extensive family history of both mother and sister who had thyroid issues and she is concerned about that and would like to have it tested. She denies any palpitations or chest pains or problems with cold or heat. She denies any hair changes more recently.  Relevant past medical, surgical, family and social history reviewed and updated as indicated. Interim medical history since our last visit reviewed. Allergies and medications reviewed and updated.  Review of Systems  Constitutional: Positive for fatigue and unexpected weight change. Negative for fever and chills.  HENT: Negative for congestion, ear discharge and ear pain.   Eyes: Negative for redness and visual disturbance.  Respiratory: Negative for chest tightness and shortness of breath.   Cardiovascular: Negative for chest pain and leg swelling.  Gastrointestinal: Positive for constipation. Negative for nausea, vomiting, abdominal pain, diarrhea and abdominal distention.  Endocrine: Negative for cold intolerance, heat intolerance, polydipsia and polyuria.  Genitourinary: Negative for dysuria and difficulty urinating.  Musculoskeletal: Negative for back pain and gait problem.  Skin: Negative for rash.  Neurological: Negative for light-headedness and headaches.    Psychiatric/Behavioral: Negative for behavioral problems and agitation.  All other systems reviewed and are negative.   Per HPI unless specifically indicated above     Medication List       This list is accurate as of: 07/15/15  4:00 PM.  Always use your most recent med list.               levocetirizine 5 MG tablet  Commonly known as:  XYZAL  Take 1 tablet (5 mg total) by mouth every evening.     omeprazole 20 MG capsule  Commonly known as:  PRILOSEC  Take 1 capsule (20 mg total) by mouth daily.           Objective:    BP 132/82 mmHg  Pulse 78  Temp(Src) 98.4 F (36.9 C) (Oral)  Ht _1  (1.702 m)  Wt 193 lb 6.4 oz (87.726 kg)  BMI 30.28 kg/m2  Wt Readings from Last 3 Encounters:  07/15/15 193 lb 6.4 oz (87.726 kg)  02/21/15 193 lb 6.4 oz (87.726 kg)  02/26/14 189 lb 12.8 oz (86.093 kg)    Physical Exam  Constitutional: She is oriented to person, place, and time. She appears well-developed and well-nourished. No distress.  HENT:  Right Ear: External ear normal.  Left Ear: External ear normal.  Nose: Nose normal.  Mouth/Throat: Oropharynx is clear and moist. No oropharyngeal exudate.  Eyes: Conjunctivae and EOM are normal. Pupils are equal, round, and reactive to light.  Neck: Neck supple. No thyromegaly present.  Cardiovascular: Normal rate, regular rhythm, normal heart sounds and intact distal pulses.   No murmur heard. Pulmonary/Chest: Effort normal and breath  sounds normal. No respiratory distress. She has no wheezes.  Abdominal: Soft. Bowel sounds are normal. She exhibits no distension. There is no tenderness. There is no rebound.  Musculoskeletal: Normal range of motion. She exhibits no edema or tenderness.  Lymphadenopathy:    She has no cervical adenopathy.  Neurological: She is alert and oriented to person, place, and time. Coordination normal.  Skin: Skin is warm and dry. No rash noted. She is not diaphoretic.  Psychiatric: She has a normal mood  and affect. Her behavior is normal.  Nursing note and vitals reviewed.      Assessment & Plan:   Problem List Items Addressed This Visit      Respiratory   Allergic rhinitis - Primary   Relevant Medications   levocetirizine (XYZAL) 5 MG tablet    Other Visit Diagnoses    Weight gain        Relevant Orders    CMP14+EGFR    Thyroid Panel With TSH    Other fatigue        Relevant Orders    CMP14+EGFR    Screening for HIV without presence of risk factors        Relevant Orders    HIV antibody    Lipid screening        Relevant Orders    Lipid panel       Follow up plan: Return if symptoms worsen or fail to improve.  Counseling provided for all of the vaccine components Orders Placed This Encounter  Procedures  . CMP14+EGFR  . Lipid panel  . Thyroid Panel With TSH  . HIV antibody    Caryl Pina, MD [facility] 07/15/2015, 4:00 PM

## 2015-07-16 ENCOUNTER — Other Ambulatory Visit: Payer: BC Managed Care – PPO

## 2015-07-16 DIAGNOSIS — R635 Abnormal weight gain: Secondary | ICD-10-CM

## 2015-07-16 DIAGNOSIS — Z114 Encounter for screening for human immunodeficiency virus [HIV]: Secondary | ICD-10-CM

## 2015-07-16 DIAGNOSIS — Z13220 Encounter for screening for lipoid disorders: Secondary | ICD-10-CM

## 2015-07-16 DIAGNOSIS — R5383 Other fatigue: Secondary | ICD-10-CM

## 2015-07-17 LAB — SPECIMEN STATUS

## 2015-07-18 LAB — CMP14+EGFR
A/G RATIO: 1.5 (ref 1.2–2.2)
ALBUMIN: 4.3 g/dL (ref 3.5–5.5)
ALT: 18 IU/L (ref 0–32)
AST: 16 IU/L (ref 0–40)
Alkaline Phosphatase: 89 IU/L (ref 39–117)
BILIRUBIN TOTAL: 0.4 mg/dL (ref 0.0–1.2)
BUN / CREAT RATIO: 21 (ref 9–23)
BUN: 14 mg/dL (ref 6–24)
CALCIUM: 9.2 mg/dL (ref 8.7–10.2)
CHLORIDE: 99 mmol/L (ref 96–106)
CO2: 21 mmol/L (ref 18–29)
Creatinine, Ser: 0.66 mg/dL (ref 0.57–1.00)
GFR, EST AFRICAN AMERICAN: 123 mL/min/{1.73_m2} (ref >59)
GFR, EST NON AFRICAN AMERICAN: 106 mL/min/{1.73_m2} (ref >59)
Globulin, Total: 2.8 g/dL (ref 1.5–4.5)
Glucose: 87 mg/dL (ref 65–99)
POTASSIUM: 4.4 mmol/L (ref 3.5–5.2)
Sodium: 136 mmol/L (ref 134–144)
TOTAL PROTEIN: 7.1 g/dL (ref 6.0–8.5)

## 2015-07-18 LAB — THYROID PANEL WITH TSH
Free Thyroxine Index: 2.1 (ref 1.2–4.9)
T3 Uptake Ratio: 25 % (ref 24–39)
T4 TOTAL: 8.3 ug/dL (ref 4.5–12.0)
TSH: 1.45 u[IU]/mL (ref 0.450–4.500)

## 2015-07-18 LAB — LIPID PANEL
CHOL/HDL RATIO: 4.3 ratio (ref 0.0–4.4)
Cholesterol, Total: 240 mg/dL — ABNORMAL HIGH (ref 100–199)
HDL: 56 mg/dL (ref >39)
LDL Calculated: 165 mg/dL — ABNORMAL HIGH (ref 0–99)
Triglycerides: 95 mg/dL (ref 0–149)
VLDL CHOLESTEROL CAL: 19 mg/dL (ref 5–40)

## 2015-07-18 LAB — HIV ANTIBODY (ROUTINE TESTING W REFLEX): HIV SCREEN 4TH GENERATION: NONREACTIVE

## 2015-07-21 ENCOUNTER — Encounter: Payer: Self-pay | Admitting: *Deleted

## 2015-07-25 ENCOUNTER — Telehealth: Payer: Self-pay | Admitting: Family Medicine

## 2015-07-25 MED ORDER — FLEET ENEMA 7-19 GM/118ML RE ENEM: 1.0000 | ENEMA | Freq: Every day | RECTAL | Status: DC | PRN

## 2015-07-25 NOTE — Telephone Encounter (Signed)
As far as medications for constipation MiraLAX is one of the better medications, everything else is over-the-counter, milk of magnesia is a possibility that she could use. I also recommend increased fiber intake and fluid intake. I would recommend to use MiraLAX every day until she starts having use your stools. If this does not work in the enema does not work then come back in and we can look for possible reasons why she is constipated. Arville Care, MD [facility] 07/25/2015, 8:01 AM

## 2015-07-25 NOTE — Telephone Encounter (Signed)
NA/ww 3/27

## 2015-08-04 ENCOUNTER — Telehealth: Payer: Self-pay | Admitting: Family Medicine

## 2015-08-09 NOTE — Telephone Encounter (Signed)
Multiple attempts to contact patient with no answer.  This encounter will now be closed

## 2016-07-31 ENCOUNTER — Other Ambulatory Visit: Payer: Self-pay | Admitting: Family Medicine

## 2016-07-31 DIAGNOSIS — J309 Allergic rhinitis, unspecified: Secondary | ICD-10-CM

## 2016-08-13 ENCOUNTER — Encounter: Payer: Self-pay | Admitting: Nurse Practitioner

## 2016-08-13 ENCOUNTER — Ambulatory Visit (INDEPENDENT_AMBULATORY_CARE_PROVIDER_SITE_OTHER): Payer: BC Managed Care – PPO | Admitting: Nurse Practitioner

## 2016-08-13 VITALS — BP 134/80 | HR 72 | Temp 97.7°F | Ht 67.0 in | Wt 171.0 lb

## 2016-08-13 DIAGNOSIS — R2231 Localized swelling, mass and lump, right upper limb: Secondary | ICD-10-CM

## 2016-08-13 MED ORDER — DOXYCYCLINE HYCLATE 100 MG PO TABS: 100.0000 mg | ORAL_TABLET | Freq: Two times a day (BID) | ORAL | 0 refills | Status: DC

## 2016-08-13 NOTE — Progress Notes (Signed)
   Subjective:    Patient ID: Carrie Jordan, female    DOB: April 29, 1971, 46 y.o.   MRN: [id number]  HPI Patient comes in today c/o palpable tender not near right axillary area. SHe says she noticed it yesterday. Had a tick bite on that side 2 days ago and did not know if was related or not. SHe had a mammogram in March which came back normal   Review of Systems  Constitutional: Negative.   HENT: Negative.   Respiratory: Negative.   Cardiovascular: Negative.   Gastrointestinal: Negative.   Genitourinary: Negative.   Neurological: Negative.   Psychiatric/Behavioral: Negative.   All other systems reviewed and are negative.      Objective:   Physical Exam  Constitutional: She is oriented to person, place, and time. She appears well-developed and well-nourished. No distress.  Cardiovascular: Normal rate, regular rhythm and normal heart sounds.   Pulmonary/Chest: Effort normal and breath sounds normal. Right breast exhibits mass. Right breast exhibits no inverted nipple, no nipple discharge, no skin change and no tenderness. Left breast exhibits no inverted nipple, no mass, no nipple discharge, no skin change and no tenderness. Breasts are symmetrical.    Neurological: She is alert and oriented to person, place, and time.  Skin: Skin is warm.  Psychiatric: She has a normal mood and affect. Her behavior is normal. Judgment and thought content normal.   BP 134/80   Pulse 72   Temp 97.7 F (36.5 C) (Oral)   Ht 5\' 7"  (1.702 m)   Wt 171 lb (77.6 kg)   BMI 26.78 kg/m        Assessment & Plan:  1. Mass of right axilla Watch area untl have ultra sound - US BREAST LTD UNI RIGHT INC AXILLA; Future - doxycycline (VIBRA-TABS) 100 MG tablet; Take 1 tablet (100 mg total) by mouth 2 (two) times daily. 1 po bid  Dispense: 20 tablet; Refill: 0  Mary-Margaret Daphine Deutscher, FNP

## 2016-08-22 ENCOUNTER — Encounter: Payer: Self-pay | Admitting: Family Medicine

## 2016-08-22 ENCOUNTER — Encounter: Payer: BC Managed Care – PPO | Admitting: Pediatrics

## 2016-09-13 ENCOUNTER — Ambulatory Visit (INDEPENDENT_AMBULATORY_CARE_PROVIDER_SITE_OTHER): Payer: BC Managed Care – PPO | Admitting: Pediatrics

## 2016-09-13 ENCOUNTER — Encounter: Payer: Self-pay | Admitting: Pediatrics

## 2016-09-13 VITALS — BP 135/89 | HR 70 | Temp 98.0°F | Ht 67.0 in | Wt 171.0 lb

## 2016-09-13 DIAGNOSIS — M25561 Pain in right knee: Secondary | ICD-10-CM

## 2016-09-13 DIAGNOSIS — Z0000 Encounter for general adult medical examination without abnormal findings: Secondary | ICD-10-CM | POA: Diagnosis not present

## 2016-09-13 DIAGNOSIS — R454 Irritability and anger: Secondary | ICD-10-CM

## 2016-09-13 DIAGNOSIS — G8929 Other chronic pain: Secondary | ICD-10-CM

## 2016-09-13 MED ORDER — ESCITALOPRAM OXALATE 10 MG PO TABS: 10.0000 mg | ORAL_TABLET | Freq: Every day | ORAL | 2 refills | Status: DC

## 2016-09-13 NOTE — Progress Notes (Signed)
  Subjective:   Patient ID: Kevan Ny, female    DOB: 07-14-1970, 46 y.o.   MRN: [id number] CC: Annual Exam  HPI: Kersha Hainer is a 46 y.o. female presenting for Annual Exam  LMP: periods irregular, last 3 mo ago until last week Having hot flashes sometimes, not weekly Not happening enough to bother her Mood swings daily, gets irritated very easily  No fam h/o breast cancer MGM with colon cancer dx in her 83s  Increasing knee pain over past year, bothering her most days now, taking OTCs, wants to see orthopedics  Relevant past medical, surgical, family and social history reviewed. Allergies and medications reviewed and updated. History  Smoking Status  . Former Smoker  . Packs/day: 1.00  . Types: Cigarettes  . Quit date: 03/01/2011  Smokeless Tobacco  . Former Neurosurgeon  . Quit date: 03/20/2011   ROS: All systems neg other than what is in HPI  Objective:    BP 135/89   Pulse 70   Temp 98 F (36.7 C) (Oral)   Ht 5\' 7"  (1.702 m)   Wt 171 lb (77.6 kg)   LMP 09/06/2016   BMI 26.78 kg/m   Wt Readings from Last 3 Encounters:  09/13/16 171 lb (77.6 kg)  08/13/16 171 lb (77.6 kg)  07/15/15 193 lb 6.4 oz (87.7 kg)    Gen: NAD, alert, cooperative with exam, NCAT EYES: EOMI, no conjunctival injection, or no icterus ENT:  TMs pearly gray b/l, OP without erythema LYMPH: no cervical LAD CV: NRRR, normal S1/S2, no murmur, distal pulses 2+ b/l Resp: CTABL, no wheezes, normal WOB Abd: +BS, soft, NTND. no guarding or organomegaly Ext: No edema, warm Neuro: Alert and oriented, strength equal b/l UE and LE, coordination grossly normal MSK: normal muscle bulk  Assessment & Plan:  Gladis was seen today for annual exam.  Diagnoses and all orders for this visit:  Encounter for preventive health examination Pap smear: UTD (9/17) Mammo: UTD (3/18)  Irritability Mood disorder Will start below  -     escitalopram (LEXAPRO) 10 MG tablet; Take 1 tablet (10 mg total) by mouth  daily.  Chronic pain of right knee -     Ambulatory referral to Orthopedic Surgery   Follow up plan: Return in about 3 months (around 12/14/2016). Rex Kras, MD [facility]

## 2017-01-14 ENCOUNTER — Ambulatory Visit (INDEPENDENT_AMBULATORY_CARE_PROVIDER_SITE_OTHER): Payer: BC Managed Care – PPO | Admitting: Family Medicine

## 2017-01-14 ENCOUNTER — Other Ambulatory Visit: Payer: Self-pay | Admitting: Pediatrics

## 2017-01-14 ENCOUNTER — Encounter: Payer: Self-pay | Admitting: Family Medicine

## 2017-01-14 VITALS — BP 161/92 | HR 77 | Temp 98.2°F | Ht 67.0 in | Wt 185.0 lb

## 2017-01-14 DIAGNOSIS — R350 Frequency of micturition: Secondary | ICD-10-CM

## 2017-01-14 DIAGNOSIS — R454 Irritability and anger: Secondary | ICD-10-CM

## 2017-01-14 DIAGNOSIS — N3090 Cystitis, unspecified without hematuria: Secondary | ICD-10-CM

## 2017-01-14 DIAGNOSIS — H6501 Acute serous otitis media, right ear: Secondary | ICD-10-CM | POA: Diagnosis not present

## 2017-01-14 LAB — MICROSCOPIC EXAMINATION
Bacteria, UA: NONE SEEN
RBC MICROSCOPIC, UA: NONE SEEN /HPF (ref 0–?)
Renal Epithel, UA: NONE SEEN /hpf

## 2017-01-14 LAB — URINALYSIS, COMPLETE
Bilirubin, UA: NEGATIVE
Glucose, UA: NEGATIVE
Ketones, UA: NEGATIVE
Leukocytes, UA: NEGATIVE
NITRITE UA: NEGATIVE
Protein, UA: NEGATIVE
RBC, UA: NEGATIVE
Specific Gravity, UA: 1.02 (ref 1.005–1.030)
Urobilinogen, Ur: 0.2 mg/dL (ref 0.2–1.0)
pH, UA: 7 (ref 5.0–7.5)

## 2017-01-14 MED ORDER — AMOXICILLIN-POT CLAVULANATE 875-125 MG PO TABS: 1.0000 | ORAL_TABLET | Freq: Two times a day (BID) | ORAL | 0 refills | Status: DC

## 2017-01-14 NOTE — Progress Notes (Signed)
Chief Complaint  Patient presents with  . Ear Pain    r>l    HPI  Patient presents today for 3 weeks of pain in the right ear. A little bit in the left 2. Additionally the paient mentions that she is having some lower back pain and frequency. Would like to be checked for UTI. Denies loss of hearing. No fever chills or sweats. The ear infection started out asjust a cold with some facial pressure.  PMH: Smoking status noted ROS: Per HPI  Objective: BP (!) 161/92   Pulse 77   Temp 98.2 F (36.8 C) (Oral)   Ht 5\' 7"  (1.702 m)   Wt 185 lb (83.9 kg)   LMP 01/07/2017   BMI 28.98 kg/m  Gen: NAD, alert, cooperative with exam HEENT: NCAT, EOMI, PERRLright TM shows air fluid level posteriorly. Left is clear. Nasal passages are erythematous. CV: RRR, good S1/S2, no murmur Resp: CTABL, no wheezes, non-labored Abd: SNTND, BS present, no guarding or organomegaly. CVAs nontender bilaterally. Ext: No edema, warm Neuro: Alert and oriented, No gross deficits  Assessment and plan:  1. Urine frequency   2. Right acute serous otitis media, recurrence not specified   3. Cystitis     Meds ordered this encounter  Medications  . amoxicillin-clavulanate (AUGMENTIN) 875-125 MG tablet    Sig: Take 1 tablet by mouth 2 (two) times daily.    Dispense:  20 tablet    Refill:  0    Orders Placed This Encounter  Procedures  . Urine Culture  . Microscopic Examination  . Urinalysis, Complete    Follow up as needed.  Mechele Claude, MD

## 2017-01-16 LAB — URINE CULTURE

## 2017-05-17 ENCOUNTER — Ambulatory Visit: Payer: BC Managed Care – PPO | Admitting: Family Medicine

## 2017-05-17 ENCOUNTER — Encounter: Payer: Self-pay | Admitting: Family Medicine

## 2017-05-17 VITALS — BP 138/78 | HR 89 | Temp 97.0°F | Ht 67.0 in | Wt 187.0 lb

## 2017-05-17 DIAGNOSIS — R1030 Lower abdominal pain, unspecified: Secondary | ICD-10-CM | POA: Diagnosis not present

## 2017-05-17 DIAGNOSIS — K5792 Diverticulitis of intestine, part unspecified, without perforation or abscess without bleeding: Secondary | ICD-10-CM

## 2017-05-17 LAB — URINALYSIS
Bilirubin, UA: NEGATIVE
GLUCOSE, UA: NEGATIVE
KETONES UA: NEGATIVE
LEUKOCYTES UA: NEGATIVE
NITRITE UA: NEGATIVE
Protein, UA: NEGATIVE
RBC, UA: NEGATIVE
Specific Gravity, UA: 1.02 (ref 1.005–1.030)
UUROB: 0.2 mg/dL (ref 0.2–1.0)
pH, UA: 7 (ref 5.0–7.5)

## 2017-05-17 MED ORDER — METRONIDAZOLE 500 MG PO TABS: 500.0000 mg | ORAL_TABLET | Freq: Two times a day (BID) | ORAL | 0 refills | Status: DC

## 2017-05-17 MED ORDER — CIPROFLOXACIN HCL 500 MG PO TABS: 500.0000 mg | ORAL_TABLET | Freq: Two times a day (BID) | ORAL | 0 refills | Status: DC

## 2017-05-17 NOTE — Patient Instructions (Signed)
Metamucil 1 tablespoon twice daily in water. 

## 2017-05-17 NOTE — Progress Notes (Signed)
fla 

## 2017-05-17 NOTE — Progress Notes (Signed)
Subjective:  Patient ID: Kevan Ny, female    DOB: Jul 04, 1970  Age: 47 y.o. MRN: [id number]  CC: Abdominal Pain (pt here today c/o lower abdominal pain)   HPI Alis Sawchuk presents for onset this morning of left lower quadrant pain.  She says it was quite intense and caused her to break out in a cold sweat.  It got worse after she had a bowel movement.  She expresses it is pressure.  She notes that she had an appendicitis with appendectomy 2 years ago and says this is a similar pain on the left as the appendicitis was on the right.  She has had no nausea vomiting or diarrhea.  She denies any hematochezia and has had no melena.  Appetite has been somewhat diminished today  Depression screen [facility] 2/9 05/17/2017 01/14/2017 09/13/2016  Decreased Interest 1 1 0  Down, Depressed, Hopeless 0 0 0  PHQ - 2 Score 1 1 0  Altered sleeping - - -  Tired, decreased energy - - -  Change in appetite - - -  Feeling bad or failure about yourself  - - -  Trouble concentrating - - -  Moving slowly or fidgety/restless - - -  Suicidal thoughts - - -  PHQ-9 Score - - -  Difficult doing work/chores - - -    History Cameka has a past medical history of GERD (gastroesophageal reflux disease) and Migraines.   She has a past surgical history that includes Tubal ligation and Appendectomy.   Her family history includes Cancer in her paternal grandmother; Colon cancer (age of onset: 43) in her maternal grandmother; Thyroid disease in her mother.She reports that she quit smoking about 6 years ago. Her smoking use included cigarettes. She smoked 1.00 pack per day. She quit smokeless tobacco use about 6 years ago. She reports that she does not drink alcohol or use drugs.    ROS Review of Systems  Constitutional: Negative for activity change, appetite change and fever.  HENT: Negative for congestion, rhinorrhea and sore throat.   Eyes: Negative for visual disturbance.  Respiratory: Negative for cough and  shortness of breath.   Cardiovascular: Negative for chest pain and palpitations.  Gastrointestinal: Positive for abdominal distention, abdominal pain and constipation (Patient relates she has had to use a laxative recently most recently 2 days ago). Negative for diarrhea and nausea.  Genitourinary: Negative for dysuria.  Musculoskeletal: Negative for arthralgias and myalgias.  Urinalysis reviewed showing no sign of acute infection.  Objective:  BP 138/78   Pulse 89   Temp (!) 97 F (36.1 C) (Oral)   Ht 5\' 7"  (1.702 m)   Wt 187 lb (84.8 kg)   BMI 29.29 kg/m   BP Readings from Last 3 Encounters:  05/17/17 138/78  01/14/17 (!) 161/92  09/13/16 135/89    Wt Readings from Last 3 Encounters:  05/17/17 187 lb (84.8 kg)  01/14/17 185 lb (83.9 kg)  09/13/16 171 lb (77.6 kg)     Physical Exam  Constitutional: She is oriented to person, place, and time. She appears well-developed and well-nourished.  HENT:  Head: Normocephalic and atraumatic.  Cardiovascular: Normal rate and regular rhythm.  No murmur heard. Pulmonary/Chest: Effort normal and breath sounds normal.  Abdominal: Soft. Bowel sounds are normal. She exhibits no mass. There is tenderness (Moderate tenderness through the left lower quadrant to suprapubic region). There is no rebound and no guarding.  Neurological: She is alert and oriented to person, place, and time.  Skin:  Skin is warm and dry.  Psychiatric: She has a normal mood and affect. Her behavior is normal.      Assessment & Plan:   Cydni was seen today for abdominal pain.  Diagnoses and all orders for this visit:  Diverticulitis  Lower abdominal pain -     Urinalysis -     Urine Culture  Other orders -     metroNIDAZOLE (FLAGYL) 500 MG tablet; Take 1 tablet (500 mg total) by mouth 2 (two) times daily. -     ciprofloxacin (CIPRO) 500 MG tablet; Take 1 tablet (500 mg total) by mouth 2 (two) times daily.       I have discontinued Jasline Tay's  escitalopram and amoxicillin-clavulanate. I am also having her start on metroNIDAZOLE and ciprofloxacin. Additionally, I am having her maintain her omeprazole, levocetirizine, and SUMAtriptan.  Allergies as of 05/17/2017   No Known Allergies     Medication List        Accurate as of 05/17/17  6:51 PM. Always use your most recent med list.          ciprofloxacin 500 MG tablet Commonly known as:  CIPRO Take 1 tablet (500 mg total) by mouth 2 (two) times daily.   levocetirizine 5 MG tablet Commonly known as:  XYZAL Take 1 tablet (5 mg total) by mouth every evening.   metroNIDAZOLE 500 MG tablet Commonly known as:  FLAGYL Take 1 tablet (500 mg total) by mouth 2 (two) times daily.   omeprazole 20 MG capsule Commonly known as:  PRILOSEC Take 1 capsule (20 mg total) by mouth daily.   SUMAtriptan 100 MG tablet Commonly known as:  IMITREX        Follow-up: Return if symptoms worsen or fail to improve.  Mechele Claude, M.D.

## 2017-05-19 LAB — URINE CULTURE

## 2017-07-12 ENCOUNTER — Encounter: Payer: Self-pay | Admitting: Family Medicine

## 2017-07-12 ENCOUNTER — Ambulatory Visit: Payer: BC Managed Care – PPO | Admitting: Family Medicine

## 2017-07-12 VITALS — BP 124/84 | HR 115 | Temp 98.5°F | Ht 67.0 in | Wt 184.6 lb

## 2017-07-12 DIAGNOSIS — K2100 Gastro-esophageal reflux disease with esophagitis, without bleeding: Secondary | ICD-10-CM

## 2017-07-12 DIAGNOSIS — K529 Noninfective gastroenteritis and colitis, unspecified: Secondary | ICD-10-CM | POA: Diagnosis not present

## 2017-07-12 MED ORDER — ONDANSETRON 8 MG PO TBDP: 8.0000 mg | ORAL_TABLET | Freq: Three times a day (TID) | ORAL | 0 refills | Status: DC | PRN

## 2017-07-12 NOTE — Progress Notes (Signed)
Subjective:  Patient ID: Kevan Ny, female    DOB: [birth year]  Age: 47 y.o. MRN: [id number]  CC: Gastroesophageal Reflux; Nausea; Emesis; and Diarrhea   HPI Kaimana Neuzil presents for onset yesterday afternoon of heartburn followed by 3 episodes of vomiting and liquidy diarrhea.  She subsequently had some dry heaves.  She has had aches all over since that time feels rundown and tired and achy but has had no fever chills or sweats.  Her appetite has been poor today but she has not had any further vomiting or diarrhea.  Of note is that she usually takes Prilosec she did not take it yesterday morning.  Additionally she had a fried bologna sandwich for breakfast which is not on her usual diet.  Depression screen [facility] 2/9 07/12/2017 05/17/2017 01/14/2017  Decreased Interest 0 1 1  Down, Depressed, Hopeless 0 0 0  PHQ - 2 Score 0 1 1  Altered sleeping - - -  Tired, decreased energy - - -  Change in appetite - - -  Feeling bad or failure about yourself  - - -  Trouble concentrating - - -  Moving slowly or fidgety/restless - - -  Suicidal thoughts - - -  PHQ-9 Score - - -  Difficult doing work/chores - - -    History Laquitta has a past medical history of GERD (gastroesophageal reflux disease) and Migraines.   She has a past surgical history that includes Tubal ligation and Appendectomy.   Her family history includes Cancer in her paternal grandmother; Colon cancer (age of onset: 14) in her maternal grandmother; Thyroid disease in her mother.She reports that she quit smoking about 6 years ago. Her smoking use included cigarettes. She smoked 1.00 pack per day. She quit smokeless tobacco use about 6 years ago. She reports that she does not drink alcohol or use drugs.    ROS Review of Systems  Constitutional: Positive for appetite change and fatigue. Negative for activity change and fever.  HENT: Negative for congestion, rhinorrhea and sore throat.   Eyes: Negative for visual disturbance.    Respiratory: Negative for cough and shortness of breath.   Cardiovascular: Negative for chest pain and palpitations.  Gastrointestinal: Positive for abdominal distention, abdominal pain, diarrhea, nausea and vomiting. Negative for blood in stool.  Genitourinary: Negative for dysuria.  Musculoskeletal: Negative for arthralgias and myalgias.    Objective:  BP 124/84   Pulse (!) 115   Temp 98.5 F (36.9 C) (Oral)   Ht 5\' 7"  (1.702 m)   Wt 184 lb 9.6 oz (83.7 kg)   BMI 28.91 kg/m   BP Readings from Last 3 Encounters:  07/12/17 124/84  05/17/17 138/78  01/14/17 (!) 161/92    Wt Readings from Last 3 Encounters:  07/12/17 184 lb 9.6 oz (83.7 kg)  05/17/17 187 lb (84.8 kg)  01/14/17 185 lb (83.9 kg)     Physical Exam  Constitutional: She is oriented to person, place, and time. She appears well-developed and well-nourished.  HENT:  Head: Normocephalic and atraumatic.  Cardiovascular: Normal rate and regular rhythm.  No murmur heard. Pulmonary/Chest: Effort normal and breath sounds normal.  Abdominal: Soft. Bowel sounds are normal. She exhibits no mass. There is tenderness (Mild, in the epigastrium only). There is no rebound and no guarding.  Neurological: She is alert and oriented to person, place, and time.  Skin: Skin is warm and dry.  Psychiatric: She has a normal mood and affect. Her behavior is normal.  Assessment & Plan:   Minie was seen today for gastroesophageal reflux, nausea, emesis and diarrhea.  Diagnoses and all orders for this visit:  Gastroesophageal reflux disease with esophagitis  Gastroenteritis  Other orders -     ondansetron (ZOFRAN ODT) 8 MG disintegrating tablet; Take 1 tablet (8 mg total) by mouth every 8 (eight) hours as needed for nausea or vomiting.       I have discontinued Okey Regal Chargois's metroNIDAZOLE and ciprofloxacin. I am also having her start on ondansetron. Additionally, I am having her maintain her omeprazole,  levocetirizine, and SUMAtriptan.  Allergies as of 07/12/2017   No Known Allergies     Medication List        Accurate as of 07/12/17  2:27 PM. Always use your most recent med list.          levocetirizine 5 MG tablet Commonly known as:  XYZAL Take 1 tablet (5 mg total) by mouth every evening.   omeprazole 20 MG capsule Commonly known as:  PRILOSEC Take 1 capsule (20 mg total) by mouth daily.   ondansetron 8 MG disintegrating tablet Commonly known as:  ZOFRAN ODT Take 1 tablet (8 mg total) by mouth every 8 (eight) hours as needed for nausea or vomiting.   SUMAtriptan 100 MG tablet Commonly known as:  IMITREX        Follow-up: Return if symptoms worsen or fail to improve.  Mechele Claude, M.D.

## 2017-07-12 NOTE — Patient Instructions (Signed)
Immodium AD as needed for Diarrhea Bland Diet for three days Parke Simmers Diet A bland diet consists of foods that do not have a lot of fat or fiber. Foods without fat or fiber are easier for the body to digest. They are also less likely to irritate your mouth, throat, stomach, and other parts of your gastrointestinal tract. A bland diet is sometimes called a BRAT diet. What is my plan? Your health care provider or dietitian may recommend specific changes to your diet to prevent and treat your symptoms, such as:  Eating small meals often.  Cooking food until it is soft enough to chew easily.  Chewing your food well.  Drinking fluids slowly.  Not eating foods that are very spicy, sour, or fatty.  Not eating citrus fruits, such as oranges and grapefruit.  What do I need to know about this diet?  Eat a variety of foods from the bland diet food list.  Do not follow a bland diet longer than you have to.  Ask your health care provider whether you should take vitamins. What foods can I eat? Grains  Hot cereals, such as cream of wheat. Bread, crackers, or tortillas made from refined white flour. Rice. Vegetables Canned or cooked vegetables. Mashed or boiled potatoes. Fruits Bananas. Applesauce. Other types of cooked or canned fruit with the skin and seeds removed, such as canned peaches or pears. Meats and Other Protein Sources Scrambled eggs. Creamy peanut butter or other nut butters. Lean, well-cooked meats, such as chicken or fish. Tofu. Soups or broths. Dairy Low-fat dairy products, such as milk, cottage cheese, or yogurt. Beverages Water. Herbal tea. Apple juice. Sweets and Desserts Pudding. Custard. Fruit gelatin. Ice cream. Fats and Oils Mild salad dressings. Canola or olive oil. The items listed above may not be a complete list of allowed foods or beverages. Contact your dietitian for more options. What foods are not recommended? Foods and ingredients that are often not  recommended include:  Spicy foods, such as hot sauce or salsa.  Fried foods.  Sour foods, such as pickled or fermented foods.  Raw vegetables or fruits, especially citrus or berries.  Caffeinated drinks.  Alcohol.  Strongly flavored seasonings or condiments.  The items listed above may not be a complete list of foods and beverages that are not allowed. Contact your dietitian for more information. This information is not intended to replace advice given to you by your health care provider. Make sure you discuss any questions you have with your health care provider. Document Released: 08/08/2015 Document Revised: 09/22/2015 Document Reviewed: 04/28/2014 Elsevier Interactive Patient Education  2018 [employer].

## 2017-07-18 ENCOUNTER — Other Ambulatory Visit: Payer: Self-pay | Admitting: *Deleted

## 2017-07-18 DIAGNOSIS — K219 Gastro-esophageal reflux disease without esophagitis: Secondary | ICD-10-CM

## 2017-07-18 MED ORDER — OMEPRAZOLE 20 MG PO CPDR: 20.0000 mg | DELAYED_RELEASE_CAPSULE | Freq: Every day | ORAL | 11 refills | Status: DC

## 2017-08-06 ENCOUNTER — Ambulatory Visit: Payer: BC Managed Care – PPO | Admitting: Physician Assistant

## 2017-08-06 ENCOUNTER — Encounter: Payer: Self-pay | Admitting: Physician Assistant

## 2017-08-06 VITALS — BP 134/78 | HR 88 | Temp 97.5°F | Ht 67.0 in | Wt 190.2 lb

## 2017-08-06 DIAGNOSIS — J029 Acute pharyngitis, unspecified: Secondary | ICD-10-CM

## 2017-08-06 DIAGNOSIS — J0100 Acute maxillary sinusitis, unspecified: Secondary | ICD-10-CM

## 2017-08-06 LAB — CULTURE, GROUP A STREP

## 2017-08-06 LAB — RAPID STREP SCREEN (MED CTR MEBANE ONLY): Strep Gp A Ag, IA W/Reflex: NEGATIVE

## 2017-08-06 MED ORDER — AMOXICILLIN-POT CLAVULANATE 875-125 MG PO TABS: 1.0000 | ORAL_TABLET | Freq: Two times a day (BID) | ORAL | 0 refills | Status: DC

## 2017-08-06 MED ORDER — METHYLPREDNISOLONE ACETATE 80 MG/ML IJ SUSP
80.0000 mg | Freq: Once | INTRAMUSCULAR | Status: AC
  Administered 2017-08-06: 80 mg via INTRAMUSCULAR

## 2017-08-06 MED ORDER — DOXYCYCLINE HYCLATE 100 MG PO TABS: 100.0000 mg | ORAL_TABLET | Freq: Two times a day (BID) | ORAL | 0 refills | Status: DC

## 2017-08-06 MED ORDER — BENZONATATE 200 MG PO CAPS: 200.0000 mg | ORAL_CAPSULE | Freq: Two times a day (BID) | ORAL | 0 refills | Status: DC | PRN

## 2017-08-07 NOTE — Progress Notes (Signed)
BP 134/78   Pulse 88   Temp (!) 97.5 F (36.4 C) (Oral)   Ht 5\' 7"  (1.702 m)   Wt 190 lb 3.2 oz (86.3 kg)   BMI 29.79 kg/m    Subjective:    Patient ID: Carrie Jordan, female    DOB: 08/17/1970, 47 y.o.   MRN: [id number]  HPI: Carrie Jordan is a 47 y.o. female presenting on 08/06/2017 for sneezing; Cough; itchy eyes; and Sore Throat  This patient has had many days of sinus headache and postnasal drainage. There is copious drainage at times. Denies any fever at this time. There has been a history of sinus infections in the past.  No history of sinus surgery. There is cough at night. It has become more prevalent in recent days. Exposed at school with any of the conditions that are currently going around.  Past Medical History:  Diagnosis Date  . GERD (gastroesophageal reflux disease)   . Migraines    Relevant past medical, surgical, family and social history reviewed and updated as indicated. Interim medical history since our last visit reviewed. Allergies and medications reviewed and updated. DATA REVIEWED: CHART IN EPIC  Family History reviewed for pertinent findings.  Review of Systems  Constitutional: Positive for chills and fatigue. Negative for activity change, appetite change and fever.  HENT: Positive for congestion, postnasal drip, sinus pressure, sinus pain and sore throat.   Eyes: Negative.   Respiratory: Positive for cough. Negative for wheezing.   Cardiovascular: Negative.  Negative for chest pain, palpitations and leg swelling.  Gastrointestinal: Negative.   Genitourinary: Negative.   Musculoskeletal: Negative.   Skin: Negative.   Neurological: Positive for headaches.    Allergies as of 08/06/2017   No Known Allergies     Medication List        Accurate as of 08/06/17 11:59 PM. Always use your most recent med list.          amoxicillin-clavulanate 875-125 MG tablet Commonly known as:  AUGMENTIN Take 1 tablet by mouth 2 (two) times daily.     benzonatate 200 MG capsule Commonly known as:  TESSALON Take 1 capsule (200 mg total) by mouth 2 (two) times daily as needed for cough.   levocetirizine 5 MG tablet Commonly known as:  XYZAL Take 1 tablet (5 mg total) by mouth every evening.   omeprazole 20 MG capsule Commonly known as:  PRILOSEC Take 1 capsule (20 mg total) by mouth daily.   SUMAtriptan 100 MG tablet Commonly known as:  IMITREX          Objective:    BP 134/78   Pulse 88   Temp (!) 97.5 F (36.4 C) (Oral)   Ht 5\' 7"  (1.702 m)   Wt 190 lb 3.2 oz (86.3 kg)   BMI 29.79 kg/m   No Known Allergies  Wt Readings from Last 3 Encounters:  08/06/17 190 lb 3.2 oz (86.3 kg)  07/12/17 184 lb 9.6 oz (83.7 kg)  05/17/17 187 lb (84.8 kg)    Physical Exam  Constitutional: She is oriented to person, place, and time. She appears well-developed and well-nourished.  HENT:  Head: Normocephalic and atraumatic.  Right Ear: Tympanic membrane and external ear normal. No middle ear effusion.  Left Ear: Tympanic membrane and external ear normal.  No middle ear effusion.  Nose: Mucosal edema and rhinorrhea present. Right sinus exhibits no maxillary sinus tenderness. Left sinus exhibits no maxillary sinus tenderness.  Mouth/Throat: Uvula is midline. Posterior oropharyngeal erythema present.  No oropharyngeal exudate. Tonsils are 1+ on the right. Tonsils are 1+ on the left.  Eyes: Pupils are equal, round, and reactive to light. Conjunctivae and EOM are normal. Right eye exhibits no discharge. Left eye exhibits no discharge.  Neck: Normal range of motion.  Cardiovascular: Normal rate, regular rhythm and normal heart sounds.  Pulmonary/Chest: Effort normal and breath sounds normal. No respiratory distress. She has no wheezes.  Abdominal: Soft.  Lymphadenopathy:    She has no cervical adenopathy.  Neurological: She is alert and oriented to person, place, and time.  Skin: Skin is warm and dry.  Psychiatric: She has a normal mood  and affect.    Results for orders placed or performed in visit on 08/06/17  Rapid Strep Screen ([facility] ONLY)  Result Value Ref Range   Strep Gp A Ag, IA W/Reflex Negative Negative  Culture, Group A Strep  Result Value Ref Range   Strep A Culture CANCELED       Assessment & Plan:   1. Sore throat - Rapid Strep Screen ([facility] ONLY) - Culture, Group A Strep  2. Acute non-recurrent maxillary sinusitis - methylPREDNISolone acetate (DEPO-MEDROL) injection 80 mg - amoxicillin-clavulanate (AUGMENTIN) 875-125 MG tablet; Take 1 tablet by mouth 2 (two) times daily.  Dispense: 20 tablet; Refill: 0 - benzonatate (TESSALON) 200 MG capsule; Take 1 capsule (200 mg total) by mouth 2 (two) times daily as needed for cough.  Dispense: 30 capsule; Refill: 0   Continue all other maintenance medications as listed above.  Follow up plan: No follow-ups on file.  Educational handout given for survey  Remus Loffler PA-C [facility] [address]  [place], [place] [address] [phone]   08/07/2017, 9:59 AM

## 2018-03-10 ENCOUNTER — Ambulatory Visit: Payer: BC Managed Care – PPO | Admitting: Pediatrics

## 2018-03-10 ENCOUNTER — Encounter: Payer: Self-pay | Admitting: Pediatrics

## 2018-03-10 VITALS — BP 136/85 | HR 78 | Temp 97.8°F | Ht 67.0 in | Wt 190.8 lb

## 2018-03-10 DIAGNOSIS — Z23 Encounter for immunization: Secondary | ICD-10-CM | POA: Diagnosis not present

## 2018-03-10 DIAGNOSIS — E785 Hyperlipidemia, unspecified: Secondary | ICD-10-CM

## 2018-03-10 DIAGNOSIS — K219 Gastro-esophageal reflux disease without esophagitis: Secondary | ICD-10-CM | POA: Diagnosis not present

## 2018-03-10 DIAGNOSIS — R030 Elevated blood-pressure reading, without diagnosis of hypertension: Secondary | ICD-10-CM

## 2018-03-10 DIAGNOSIS — G43809 Other migraine, not intractable, without status migrainosus: Secondary | ICD-10-CM | POA: Diagnosis not present

## 2018-03-10 DIAGNOSIS — M7701 Medial epicondylitis, right elbow: Secondary | ICD-10-CM

## 2018-03-10 MED ORDER — OMEPRAZOLE 20 MG PO CPDR: 20.0000 mg | DELAYED_RELEASE_CAPSULE | Freq: Every day | ORAL | 11 refills | Status: DC

## 2018-03-10 MED ORDER — SUMATRIPTAN SUCCINATE 100 MG PO TABS: 100.0000 mg | ORAL_TABLET | ORAL | 3 refills | Status: DC | PRN

## 2018-03-10 NOTE — Progress Notes (Signed)
  Subjective:   Patient ID: Carrie Jordan, female    DOB: Jan 22, 1971, 47 y.o.   MRN: [id number] CC: Medical Management of Chronic Issues  HPI: [name] is a 47 y.o. female   GERD: Taking omeprazole 20 mg every morning.  Rarely forgets it, sometimes has breakthrough symptoms.  Once had breakthrough symptoms with some stew with hot sauce, tomatoes and onions when she did take the Prilosec in the morning.  Menopause: Irregular periods, going several months, less than 12 months between periods now.  Does get hot flashes.  Migraines: happening rarely. Sometimes computers are a trigger. Takes OTC first, then sumatriptan if needed, taking less than once a week, probably about once a month.  Right elbow has been bothering her.  Cooks at the school, also has to load shelves overhead, has been raking more than usual.  Relevant past medical, surgical, family and social history reviewed. Allergies and medications reviewed and updated. Social History   Tobacco Use  Smoking Status Former Smoker  . Packs/day: 1.00  . Types: Cigarettes  . Last attempt to quit: 03/01/2011  . Years since quitting: 7.0  Smokeless Tobacco Former Systems developer  . Quit date: 03/20/2011   ROS: Per HPI   Objective:    BP 136/85   Pulse 78   Temp 97.8 F (36.6 C) (Oral)   Ht '5\' 7"'$  (1.702 m)   Wt 190 lb 12.8 oz (86.5 kg)   BMI 29.88 kg/m   Wt Readings from Last 3 Encounters:  03/10/18 190 lb 12.8 oz (86.5 kg)  08/06/17 190 lb 3.2 oz (86.3 kg)  07/12/17 184 lb 9.6 oz (83.7 kg)    Gen: NAD, alert, cooperative with exam, NCAT EYES: EOMI, no conjunctival injection, or no icterus ENT:  TMs dull gray with layering clear effusions b/l, OP without erythema LYMPH: no cervical LAD CV: NRRR, normal S1/S2, no murmur, distal pulses 2+ b/l Resp: CTABL, no wheezes, normal WOB Ext: No edema, warm Neuro: Alert and oriented, strength equal b/l UE and LE, coordination grossly normal MSK: normal muscle bulk  Assessment & Plan:    Clois was seen today for medical management of chronic issues.  Diagnoses and all orders for this visit:  Medial epicondylitis of right elbow Counterforce brace, NSAIDs, rest as able.  If not improving will refer.  GERD (gastroesophageal reflux disease) Stable, continue below..  Ends as needed. -     omeprazole (PRILOSEC) 20 MG capsule; Take 1 capsule (20 mg total) by mouth daily.  Other migraine without status migrainosus, not intractable Stable, continue below as needed. -     SUMAtriptan (IMITREX) 100 MG tablet; Take 1 tablet (100 mg total) by mouth every 2 (two) hours as needed for migraine.  Elevated blood pressure reading Patient to check blood pressures at home, follow-up in 6 to 8 weeks. -     CMP14+EGFR -     TSH  Hyperlipidemia, unspecified hyperlipidemia type Elevated in the past, not currently on medicine.  Will recheck.  Sister with early heart attack that she also had diabetes. -     Lipid panel   Follow up plan: Return in about 6 weeks (around 04/21/2018). Assunta Found, MD [place]

## 2018-03-10 NOTE — Patient Instructions (Addendum)
Check blood pressure at home.  Let me know if regularly greater than 135 on top or greater than 85 on bottom.  Goal blood pressure is 120s over 70s.  Golfer's Elbow Rehab Ask your health care provider which exercises are safe for you. Do exercises exactly as told by your health care provider and adjust them as directed. It is normal to feel mild stretching, pulling, tightness, or discomfort as you do these exercises, but you should stop right away if you feel sudden pain or your pain gets worse. Do not begin these exercises until told by your health care provider. Stretching and range of motion exercises These exercises warm up your muscles and joints and improve the movement and flexibility of your elbow. Exercise A: Wrist flexors  1. Straighten your left / right elbow in front of you with your palm up. If told by your health care provider, do this stretch with your elbow bent rather than straight. 2. With your other hand, gently pull your left / right hand and fingers toward you until you feel a gentle stretch on the top of your forearm. 3. Hold this position for __________ seconds. Repeat __________ times. Complete this exercise __________ times a day. Strengthening exercises These exercises build strength and endurance in your elbow. Endurance is the ability to use your muscles for a long time, even after several repetitions. Exercise B: Wrist flexion  1. Sit with your left / right forearm palm-up and supported on a table or other surface. 2. Let your left / right wrist extend over the edge of the surface. 3. Hold a __________ weight or a piece of rubber exercise band or tubing. Slowly curl your hand up toward your forearm. Try to only move your hand and keep the rest of your arm still. 4. Hold this position for __________ seconds. 5. Slowly return to the starting position. Repeat __________ times. Complete this exercise __________ times a day. Exercise C: Eccentric wrist flexion 1. Sit  with your left / right forearm palm-up and supported on a table or other surface. 2. Let your left / right wrist extend over the edge of the surface. 3. Hold a __________ weight or a piece of rubber exercise band or tubing. 4. Use your other hand to move your left / right hand up toward your forearm. 5. Slowly return to the starting position using only your left / right hand. Repeat __________ times. Complete this exercise __________ times a day. Exercise D: Hand turns, pronation i 1. Sit with your left / right forearm supported on a table or other surface. 2. Move your wrist so your pinkie travels toward your forearm and your thumb moves away from your forearm. 3. Hold this position for __________ seconds. 4. Slowly return to the starting position. Exercise E: Hand turns, pronation ii  1. Sit with your left / right forearm supported on a table or other surface. 2. Hold a hammer in your left / right hand. ? The exercise will be easier if you hold on near the head of the hammer. ? If you hold on toward the end of the handle, the exercise will be harder. 3. Rest your left / right hand over the edge of the table with your palm facing up. 4. Without moving your elbow, slowly turn your palm and your hand down toward the table. 5. Hold this position for __________ seconds. 6. Slowly return to the starting position. Repeat __________ times. Complete this exercise __________ times a day. Exercise  F: Shoulder blade squeeze 1. Sit in a stable chair with good posture. Do not let your back touch the back of the chair. 2. Your arms should be at your sides with your elbows bent. You can rest your forearms on a pillow. 3. Squeeze your shoulder blades together. Keep your shoulders level. Do not lift your shoulders up toward your ears. 4. Hold this position for __________ seconds. 5. Slowly release. Repeat __________ times. Complete this exercise __________ times a day. This information is not intended  to replace advice given to you by your health care provider. Make sure you discuss any questions you have with your health care provider. Document Released: 04/16/2005 Document Revised: 12/22/2015 Document Reviewed: 12/27/2014 Elsevier Interactive Patient Education  2018 [employer]. How to Perform the Epley Maneuver The Epley maneuver is an exercise that relieves symptoms of vertigo. Vertigo is the feeling that you or your surroundings are moving when they are not. When you feel vertigo, you may feel like the room is spinning and have trouble walking. Dizziness is a little different than vertigo. When you are dizzy, you may feel unsteady or light-headed. You can do this maneuver at home whenever you have symptoms of vertigo. You can do it up to 3 times a day until your symptoms go away. Even though the Epley maneuver may relieve your vertigo for a few weeks, it is possible that your symptoms will return. This maneuver relieves vertigo, but it does not relieve dizziness. What are the risks? If it is done correctly, the Epley maneuver is considered safe. Sometimes it can lead to dizziness or nausea that goes away after a short time. If you develop other symptoms, such as changes in vision, weakness, or numbness, stop doing the maneuver and call your health care provider. How to perform the Epley maneuver 4. Sit on the edge of a bed or table with your back straight and your legs extended or hanging over the edge of the bed or table. 5. Turn your head halfway toward the affected ear or side. 6. Lie backward quickly with your head turned until you are lying flat on your back. You may want to position a pillow under your shoulders. 7. Hold this position for 30 seconds. You may experience an attack of vertigo. This is normal. 8. Turn your head to the opposite direction until your unaffected ear is facing the floor. 9. Hold this position for 30 seconds. You may experience an attack of vertigo. This is  normal. Hold this position until the vertigo stops. 10. Turn your whole body to the same side as your head. Hold for another 30 seconds. 11. Sit back up. You can repeat this exercise up to 3 times a day. Follow these instructions at home:  After doing the Epley maneuver, you can return to your normal activities.  Ask your health care provider if there is anything you should do at home to prevent vertigo. He or she may recommend that you: ? Keep your head raised (elevated) with two or more pillows while you sleep. ? Do not sleep on the side of your affected ear. ? Get up slowly from bed. ? Avoid sudden movements during the day. ? Avoid extreme head movement, like looking up or bending over. Contact a health care provider if:  Your vertigo gets worse.  You have other symptoms, including: ? Nausea. ? Vomiting. ? Headache. Get help right away if:  You have vision changes.  You have a severe or worsening  headache or neck pain.  You cannot stop vomiting.  You have new numbness or weakness in any part of your body. Summary  Vertigo is the feeling that you or your surroundings are moving when they are not.  The Epley maneuver is an exercise that relieves symptoms of vertigo.  If the Epley maneuver is done correctly, it is considered safe. You can do it up to 3 times a day. This information is not intended to replace advice given to you by your health care provider. Make sure you discuss any questions you have with your health care provider. Document Released: 04/21/2013 Document Revised: 03/06/2016 Document Reviewed: 03/06/2016 Elsevier Interactive Patient Education  2017 [employer].

## 2018-03-11 LAB — CMP14+EGFR
A/G RATIO: 1.6 (ref 1.2–2.2)
ALT: 15 IU/L (ref 0–32)
AST: 14 IU/L (ref 0–40)
Albumin: 4.4 g/dL (ref 3.5–5.5)
Alkaline Phosphatase: 109 IU/L (ref 39–117)
BUN / CREAT RATIO: 16 (ref 9–23)
BUN: 13 mg/dL (ref 6–24)
Bilirubin Total: 0.3 mg/dL (ref 0.0–1.2)
CALCIUM: 9.7 mg/dL (ref 8.7–10.2)
CO2: 24 mmol/L (ref 20–29)
Chloride: 103 mmol/L (ref 96–106)
Creatinine, Ser: 0.79 mg/dL (ref 0.57–1.00)
GFR, EST AFRICAN AMERICAN: 102 mL/min/{1.73_m2} (ref >59)
GFR, EST NON AFRICAN AMERICAN: 88 mL/min/{1.73_m2} (ref >59)
GLOBULIN, TOTAL: 2.7 g/dL (ref 1.5–4.5)
Glucose: 97 mg/dL (ref 65–99)
POTASSIUM: 4.6 mmol/L (ref 3.5–5.2)
SODIUM: 141 mmol/L (ref 134–144)
Total Protein: 7.1 g/dL (ref 6.0–8.5)

## 2018-03-11 LAB — LIPID PANEL
CHOL/HDL RATIO: 4.2 ratio (ref 0.0–4.4)
Cholesterol, Total: 243 mg/dL — ABNORMAL HIGH (ref 100–199)
HDL: 58 mg/dL (ref >39)
LDL Calculated: 162 mg/dL — ABNORMAL HIGH (ref 0–99)
Triglycerides: 117 mg/dL (ref 0–149)
VLDL Cholesterol Cal: 23 mg/dL (ref 5–40)

## 2018-03-11 LAB — TSH: TSH: 0.356 u[IU]/mL — AB (ref 0.450–4.500)

## 2018-03-19 ENCOUNTER — Other Ambulatory Visit: Payer: Self-pay | Admitting: Pediatrics

## 2018-03-19 DIAGNOSIS — E0590 Thyrotoxicosis, unspecified without thyrotoxic crisis or storm: Secondary | ICD-10-CM

## 2018-03-20 ENCOUNTER — Telehealth: Payer: Self-pay | Admitting: Pediatrics

## 2018-03-20 ENCOUNTER — Other Ambulatory Visit: Payer: BC Managed Care – PPO

## 2018-03-20 DIAGNOSIS — E0590 Thyrotoxicosis, unspecified without thyrotoxic crisis or storm: Secondary | ICD-10-CM

## 2018-03-21 LAB — TSH: TSH: 0.378 u[IU]/mL — ABNORMAL LOW (ref 0.450–4.500)

## 2018-03-21 LAB — T3: T3 TOTAL: 154 ng/dL (ref 71–180)

## 2018-03-21 LAB — T4, FREE: Free T4: 1.3 ng/dL (ref 0.82–1.77)

## 2018-03-21 NOTE — Telephone Encounter (Signed)
Aware of results per result note.  Labs redrawn 11/21

## 2018-03-26 ENCOUNTER — Other Ambulatory Visit: Payer: Self-pay | Admitting: Pediatrics

## 2018-03-26 DIAGNOSIS — E0590 Thyrotoxicosis, unspecified without thyrotoxic crisis or storm: Secondary | ICD-10-CM

## 2018-05-15 ENCOUNTER — Encounter: Payer: Self-pay | Admitting: "Endocrinology

## 2018-05-15 ENCOUNTER — Ambulatory Visit: Payer: BC Managed Care – PPO | Admitting: "Endocrinology

## 2018-05-15 VITALS — BP 141/80 | HR 76 | Ht 67.0 in | Wt 193.0 lb

## 2018-05-15 DIAGNOSIS — E0590 Thyrotoxicosis, unspecified without thyrotoxic crisis or storm: Secondary | ICD-10-CM | POA: Diagnosis not present

## 2018-05-15 NOTE — Progress Notes (Signed)
Endocrinology Consult Note                                            05/15/2018, 4:59 PM   Subjective:    Patient ID: Carrie Jordan, female    DOB: 03-24-1971, PCP Johna Sheriff, MD   Past Medical History:  Diagnosis Date  . GERD (gastroesophageal reflux disease)   . Migraines    Past Surgical History:  Procedure Laterality Date  . APPENDECTOMY    . TUBAL LIGATION     Social History   Socioeconomic History  . Marital status: Married    Spouse name: Not on file  . Number of children: 2  . Years of education: Not on file  . Highest education level: Not on file  Occupational History  . Occupation: cafeteria school  Social Needs  . Financial resource strain: Not on file  . Food insecurity:    Worry: Not on file    Inability: Not on file  . Transportation needs:    Medical: Not on file    Non-medical: Not on file  Tobacco Use  . Smoking status: Former Smoker    Packs/day: 1.00    Types: Cigarettes    Last attempt to quit: 03/01/2011    Years since quitting: 7.2  . Smokeless tobacco: Former Neurosurgeon    Quit date: 03/20/2011  Substance and Sexual Activity  . Alcohol use: No  . Drug use: No  . Sexual activity: Not on file  Lifestyle  . Physical activity:    Days per week: Not on file    Minutes per session: Not on file  . Stress: Not on file  Relationships  . Social connections:    Talks on phone: Not on file    Gets together: Not on file    Attends religious service: Not on file    Active member of club or organization: Not on file    Attends meetings of clubs or organizations: Not on file    Relationship status: Not on file  Other Topics Concern  . Not on file  Social History Narrative   Lives w/ husband & 2 daughters 5&17   Outpatient Encounter Medications as of 05/15/2018  Medication Sig  . levocetirizine (XYZAL) 5 MG tablet Take 5 mg by mouth every evening.  Marland Kitchen omeprazole (PRILOSEC) 20 MG capsule Take 1 capsule (20 mg total) by mouth daily.   . SUMAtriptan (IMITREX) 100 MG tablet Take 1 tablet (100 mg total) by mouth every 2 (two) hours as needed for migraine.   No facility-administered encounter medications on file as of 05/15/2018.    ALLERGIES: Allergies  Allergen Reactions  . Codeine Nausea And Vomiting    VACCINATION STATUS: Immunization History  Administered Date(s) Administered  . Influenza,inj,Quad PF,6+ Mos 03/10/2018  . Tdap 07/20/2013    HPI Carrie Jordan is 48 y.o. female who presents today with a medical history as above. she is being seen in consultation for subclinical hyperthyroidism requested by Johna Sheriff, MD.   -She denies prior history of thyroid dysfunction.  She was found to have suppressed TSH on 2 separate occasions in November 2019.  She denies significant weight loss, palpitations, tremors.  She reports mild heat intolerance.  She has a significant family history of thyroid dysfunction in 3 family members including her mother, siblings, as well as  grandparent.  Denies dysphagia, shortness of breath, nor voice change.  Denies any exposure to thyroid hormone supplements nor antithyroid medications. -She complains of progressive weight gain/inability to lose weight.  Review of Systems  Constitutional: no significant weight gain/loss, no fatigue, no subjective hyperthermia, no subjective hypothermia Eyes: no blurry vision, no xerophthalmia ENT: no sore throat, no nodules palpated in throat, no dysphagia/odynophagia, no hoarseness Cardiovascular: no Chest Pain, no Shortness of Breath, no palpitations, no leg swelling Respiratory: no cough, no SOB Gastrointestinal: no Nausea/Vomiting/Diarhhea Musculoskeletal: no muscle/joint aches Skin: no rashes Neurological: no tremors, no numbness, no tingling, no dizziness Psychiatric: no depression, no anxiety  Objective:    BP (!) 141/80   Pulse 76   Ht 5\' 7"  (1.702 m)   Wt 193 lb (87.5 kg)   BMI 30.23 kg/m   Wt Readings from Last 3  Encounters:  05/15/18 193 lb (87.5 kg)  03/10/18 190 lb 12.8 oz (86.5 kg)  08/06/17 190 lb 3.2 oz (86.3 kg)    Physical Exam  Constitutional: Overweight for height, not in acute distress, normal state of mind Eyes: PERRLA, EOMI, no exophthalmos ENT: moist mucous membranes, no thyromegaly, no cervical lymphadenopathy Cardiovascular: normal precordial activity, Regular Rate and Rhythm, no Murmur/Rubs/Gallops Respiratory:  adequate breathing efforts, no gross chest deformity, Clear to auscultation bilaterally Gastrointestinal: abdomen soft, Non -tender, No distension, Bowel Sounds present Musculoskeletal: no gross deformities, strength intact in all four extremities Skin: moist, warm, no rashes Neurological: no tremor with outstretched hands, Deep tendon reflexes normal in all four extremities.  CMP ( most recent) CMP     Component Value Date/Time   NA 141 03/10/2018 0923   K 4.6 03/10/2018 0923   K 4.0 07/18/2011 1242   CL 103 03/10/2018 0923   CL 100 07/18/2011 1242   CO2 24 03/10/2018 0923   GLUCOSE 97 03/10/2018 0923   BUN 13 03/10/2018 0923   CREATININE 0.79 03/10/2018 0923   CREATININE 0.65 07/18/2011 1242   CALCIUM 9.7 03/10/2018 0923   CALCIUM 9.8 07/18/2011 1242   PROT 7.1 03/10/2018 0923   PROT 7.5 07/18/2011 1242   ALBUMIN 4.4 03/10/2018 0923   AST 14 03/10/2018 0923   AST 14 07/18/2011 1242   ALT 15 03/10/2018 0923   ALKPHOS 109 03/10/2018 0923   ALKPHOS 88 07/18/2011 1242   BILITOT 0.3 03/10/2018 0923   BILITOT 0.4 07/18/2011 1242   GFRNONAA 88 03/10/2018 0923   GFRAA 102 03/10/2018 0923    Lipid Panel     Component Value Date/Time   CHOL 243 (H) 03/10/2018 0923   CHOL 218 07/18/2011 1245   TRIG 117 03/10/2018 0923   TRIG 112 07/18/2011 1245   HDL 58 03/10/2018 0923   CHOLHDL 4.2 03/10/2018 0923   LDLCALC 162 (H) 03/10/2018 0923      Lab Results  Component Value Date   TSH 0.378 (L) 03/20/2018   TSH 0.356 (L) 03/10/2018   TSH 1.450 07/16/2015    TSH 1.90 07/18/2011   FREET4 1.30 03/20/2018     Assessment & Plan:   1. Subclinical hyperthyroidism - Carrie Jordan  is being seen at a kind request of Johna Sheriff, MD. - I have reviewed her available thyroid records and clinically evaluated the patient. - Based on reviews, she has subclinical hyperthyroidism, which will not require intervention at this time.  She will have repeat more complete thyroid function tests with office visit in 3 months.  If her  labs are suggestive of hyperthyroidism, she  will be considered for thyroid uptake and scan to confirm diagnosis.  - I did not initiate any new prescriptions today. - I advised her  to maintain close follow up with Johna Sheriff, MD for primary care needs.   - Time spent with the patient: 35 minutes, of which >50% was spent in obtaining information about her symptoms, reviewing her previous labs, evaluations, and treatments, counseling her about her of clinical hyperthyroidism, and developing a plan to confirm the diagnosis and long term treatment as necessary.  Carrie Jordan participated in the discussions, expressed understanding, and voiced agreement with the above plans.  All questions were answered to her satisfaction. she is encouraged to contact clinic should she have any questions or concerns prior to her return visit.  Follow up plan: Return in about 3 months (around 08/14/2018) for Follow up with Pre-visit Labs.   Marquis Lunch, MD [facility] [address] [place], [place] [address] Phone: [phone]  Fax: [phone]     05/15/2018, 4:59 PM  This note was partially dictated with voice recognition software. Similar sounding words can be transcribed inadequately or may not  be corrected upon review.

## 2018-05-28 ENCOUNTER — Encounter: Payer: Self-pay | Admitting: Nurse Practitioner

## 2018-05-28 ENCOUNTER — Ambulatory Visit: Payer: BC Managed Care – PPO | Admitting: Nurse Practitioner

## 2018-05-28 VITALS — BP 157/92 | HR 68 | Temp 97.0°F

## 2018-05-28 DIAGNOSIS — H1012 Acute atopic conjunctivitis, left eye: Secondary | ICD-10-CM | POA: Diagnosis not present

## 2018-05-28 DIAGNOSIS — R220 Localized swelling, mass and lump, head: Secondary | ICD-10-CM

## 2018-05-28 MED ORDER — PREDNISONE 20 MG PO TABS: ORAL_TABLET | ORAL | 0 refills | Status: DC

## 2018-05-28 NOTE — Progress Notes (Signed)
   Subjective:    Patient ID: Carrie Jordan, female    DOB: [birth year], 48 y.o.   MRN: [id number]   Chief Complaint: Allergies   HPI Patient come sin with red eyes. She says when she gets around certain animals her eyes will swell and itch. She says she moved a baby deer off side of road one time and eyes reacted. 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   Review of Systems  Constitutional: Negative.   HENT:       Facial swelling  Eyes:       Eyes swollen  Respiratory: Negative for shortness of breath.   Cardiovascular: Negative.   Gastrointestinal: Negative.   Genitourinary: Negative.   Neurological: Negative.   Psychiatric/Behavioral: Negative.   All other systems reviewed and are negative.      Objective:   Physical Exam Vitals signs and nursing note reviewed.  Constitutional:      General: She is not in acute distress.    Appearance: She is normal weight.  HENT:     Head:     Comments: Mild left facial swelling    Right Ear: Tympanic membrane, ear canal and external ear normal.     Left Ear: Tympanic membrane, ear canal and external ear normal.     Nose: Nose normal.     Mouth/Throat:     Mouth: Mucous membranes are moist.  Eyes:     General: Scleral icterus present.        Right eye: Discharge (clear thick ) present.     Extraocular Movements: Extraocular movements intact.     Pupils: Pupils are equal, round, and reactive to light.  Neck:     Musculoskeletal: Normal range of motion.  Cardiovascular:     Rate and Rhythm: Normal rate and regular rhythm.  Pulmonary:     Breath sounds: Normal breath sounds.  Skin:    General: Skin is warm and dry.  Neurological:     General: No focal deficit present.     Mental Status: She is alert.    Psychiatric:        Mood and Affect: Mood normal.        Behavior: Behavior normal.    BP (!) 157/92   Pulse 68   Temp (!) 97 F (36.1 C) (Oral)         Assessment & Plan:  Carrie Jordan in today with chief complaint of Allergies   1. Facial swelling - predniSONE (DELTASONE) 20 MG tablet; 2 po at sametime daily for 5 days  Dispense: 10 tablet; Refill: 0  2. Allergic conjunctivitis of left eye - Ambulatory referral to Allergy  Avoid rubbing eyes Avoid animals OTC allergy drops Take zyrtec daily Meds ordered this encounter  Medications  . predniSONE (DELTASONE) 20 MG tablet    Sig: 2 po at sametime daily for 5 days    Dispense:  10 tablet    Refill:  0    Order Specific Question:   Supervising Provider    Answer:   Nils Pyle [[id number]]    Mary-Margaret Daphine Deutscher, FNP

## 2018-05-28 NOTE — Patient Instructions (Signed)
Allergies, Adult  An allergy means that your body reacts to something that bothers it (allergen). It is not a normal reaction. This can happen from something that you:   Eat.   Breathe in.   Touch.  You can have an allergy (be allergic) to:   Outdoor things, like:  ? Pollen.  ? Grass.  ? Weeds.   Indoor things, like:  ? Dust.  ? Smoke.  ? Pet dander.   Foods.   Medicines.   Things that bother your skin, like:  ? Detergents.  ? Chemicals.  ? Latex.   Perfume.   Bugs.  An allergy cannot spread from person to person (is not contagious).  Follow these instructions at home:               Stay away from things that you know you are allergic to.   If you have allergies to things in the air, wash out your nose each day. Do it with one of these:  ? A salt-water (saline) spray.  ? A container (neti pot).   Take over-the-counter and prescription medicines only as told by your doctor.   Keep all follow-up visits as told by your doctor. This is important.   If you are at risk for a very bad allergy reaction (anaphylaxis), keep an auto-injector with you all the time. This is called an epinephrine injection.  ? This is pre-measured medicine with a needle. You can put it into your skin by yourself.  ? Right after you have a very bad allergy reaction, you or a person with you must give the medicine in less than a few minutes. This is an emergency.   If you have ever had a very bad allergy reaction, wear a medical alert bracelet or necklace. Your very bad allergy should be written on it.  Contact a health care provider if:   Your symptoms do not get better with treatment.  Get help right away if:   You have symptoms of a very bad allergy reaction. These include:  ? A swollen mouth, tongue, or throat.  ? Pain or tightness in your chest.  ? Trouble breathing.  ? Being short of breath.  ? Dizziness.  ? Fainting.  ? Very bad pain in your belly (abdomen).  ? Throwing up (vomiting).  ? Watery poop  (diarrhea).  Summary   An allergy means that your body reacts to something that bothers it (allergen). It is not a normal reaction.   Stay away from things that make your body react.   Take over-the-counter and prescription medicines only as told by your doctor.   If you are at risk for a very bad allergy reaction, carry an auto-injector (epinephrine injection) all the time. Also, wear a medical alert bracelet or necklace so people know about your allergy.  This information is not intended to replace advice given to you by your health care provider. Make sure you discuss any questions you have with your health care provider.  Document Released: 08/11/2012 Document Revised: 07/30/2016 Document Reviewed: 07/30/2016  Elsevier Interactive Patient Education  2019 [employer] Inc[employer]

## 2018-06-06 ENCOUNTER — Ambulatory Visit: Payer: BC Managed Care – PPO | Admitting: Family

## 2018-06-06 ENCOUNTER — Encounter: Payer: Self-pay | Admitting: Family

## 2018-06-06 VITALS — BP 138/88 | HR 93 | Temp 98.2°F | Ht 67.0 in | Wt 192.0 lb

## 2018-06-06 DIAGNOSIS — J101 Influenza due to other identified influenza virus with other respiratory manifestations: Secondary | ICD-10-CM | POA: Diagnosis not present

## 2018-06-06 DIAGNOSIS — R059 Cough, unspecified: Secondary | ICD-10-CM

## 2018-06-06 DIAGNOSIS — R05 Cough: Secondary | ICD-10-CM | POA: Diagnosis not present

## 2018-06-06 LAB — VERITOR FLU A/B WAIVED
Influenza A: POSITIVE — AB
Influenza B: NEGATIVE

## 2018-06-06 MED ORDER — OSELTAMIVIR PHOSPHATE 75 MG PO CAPS: 75.0000 mg | ORAL_CAPSULE | Freq: Two times a day (BID) | ORAL | 0 refills | Status: DC

## 2018-06-06 NOTE — Patient Instructions (Signed)

## 2018-06-06 NOTE — Progress Notes (Signed)
Subjective:    Patient ID: Carrie Jordan, female    DOB: [birth year], 48 y.o.   MRN: [id number]  Chief Complaint  Patient presents with  . Cough    chest congestion    Cough  This is a new problem. The current episode started in the past 7 days. The problem has been waxing and waning. The problem occurs every few minutes. The cough is non-productive. Associated symptoms include chills, headaches, myalgias, nasal congestion, postnasal drip, rhinorrhea, a sore throat and wheezing. Pertinent negatives include no ear congestion, ear pain or fever. The symptoms are aggravated by lying down. She has tried rest and OTC cough suppressant for the symptoms. The treatment provided mild relief. There is no history of asthma.      Review of Systems  Constitutional: Positive for chills. Negative for fever.  HENT: Positive for postnasal drip, rhinorrhea and sore throat. Negative for ear pain.   Respiratory: Positive for cough and wheezing.   Musculoskeletal: Positive for myalgias.  Neurological: Positive for headaches.  All other systems reviewed and are negative.      Objective:   Physical Exam Vitals signs reviewed.  Constitutional:      General: She is not in acute distress.    Appearance: She is well-developed. She is ill-appearing.  HENT:     Head: Normocephalic and atraumatic.     Right Ear: External ear normal.     Nose: Mucosal edema present.     Mouth/Throat:     Pharynx: Posterior oropharyngeal erythema present.  Eyes:     Pupils: Pupils are equal, round, and reactive to light.  Neck:     Musculoskeletal: Normal range of motion and neck supple.     Thyroid: No thyromegaly.  Cardiovascular:     Rate and Rhythm: Normal rate and regular rhythm.     Heart sounds: Normal heart sounds. No murmur.  Pulmonary:     Effort: Pulmonary effort is normal. No respiratory distress.     Breath sounds: Normal breath sounds. No wheezing.     Comments: Intermittent coarse nonproductive  cough Abdominal:     General: Bowel sounds are normal. There is no distension.     Palpations: Abdomen is soft.     Tenderness: There is no abdominal tenderness.  Musculoskeletal: Normal range of motion.        General: No tenderness.  Skin:    General: Skin is warm and dry.  Neurological:     Mental Status: She is alert and oriented to person, place, and time.     Cranial Nerves: No cranial nerve deficit.     Deep Tendon Reflexes: Reflexes are normal and symmetric.  Psychiatric:        Behavior: Behavior normal.        Thought Content: Thought content normal.        Judgment: Judgment normal.       BP 138/88   Pulse 93   Temp 98.2 F (36.8 C) (Oral)   Ht 5\' 7"  (1.702 m)   Wt 192 lb (87.1 kg)   BMI 30.07 kg/m      Assessment & Plan:  Vedia Perk comes in today with chief complaint of Cough (chest congestion)   Diagnosis and orders addressed:  1. Cough - Veritor Flu A/B Waived  2. Influenza A Rest Force fluids Tylenol or Motrin  Droplet precautions discussed RTO if symptoms worsen or do not improve  - oseltamivir (TAMIFLU) 75 MG capsule; Take 1 capsule (75 mg total)  by mouth 2 (two) times daily.  Dispense: 10 capsule; Refill: 0     Jannifer Rodney, FNP

## 2018-06-13 ENCOUNTER — Ambulatory Visit: Payer: BC Managed Care – PPO | Admitting: Allergy & Immunology

## 2018-06-13 ENCOUNTER — Encounter: Payer: Self-pay | Admitting: Allergy & Immunology

## 2018-06-13 VITALS — BP 122/80 | HR 70 | Temp 98.0°F | Resp 18 | Ht 67.5 in | Wt 192.6 lb

## 2018-06-13 DIAGNOSIS — J302 Other seasonal allergic rhinitis: Secondary | ICD-10-CM

## 2018-06-13 DIAGNOSIS — J3089 Other allergic rhinitis: Secondary | ICD-10-CM

## 2018-06-13 MED ORDER — MONTELUKAST SODIUM 10 MG PO TABS: 10.0000 mg | ORAL_TABLET | Freq: Every day | ORAL | 2 refills | Status: DC

## 2018-06-13 NOTE — Patient Instructions (Addendum)
1. Seasonal and perennial allergic rhinitis - Testing today showed: horse, trees, weeds, grasses, indoor molds, outdoor molds, cat and cockroach  - We are going to send some labs to confirm the cattle allergy. - We will call you in 1-2 weeks with the results of the testing. - Copy of test results provided.  - Avoidance measures provided. - Start taking: Allegra (fexofenadine) 180mg  table once daily, Singulair (montelukast) 10mg  daily and Nasacort (triamcinolone) one spray per nostril daily - You can use an extra dose of the antihistamine, if needed, for breakthrough symptoms.  - Consider nasal saline rinses 1-2 times daily to remove allergens from the nasal cavities as well as help with mucous clearance (this is especially helpful to do before the nasal sprays are given) - Consider allergy shots as a means of long-term control. - Allergy shots "re-train" and "reset" the immune system to ignore environmental allergens and decrease the resulting immune response to those allergens (sneezing, itchy watery eyes, runny nose, nasal congestion, etc).    - Allergy shots improve symptoms in 75-85% of patients.  - We can discuss more at the next appointment if the medications are not working for you.  2. Return in about 3 months (around 09/11/2018).   Please inform us of any Emergency Department visits, hospitalizations, or changes in symptoms. Call us before going to the ED for breathing or allergy symptoms since we might be able to fit you in for a sick visit. Feel free to contact us anytime with any questions, problems, or concerns.  It was a pleasure to meet you today!  Websites that have reliable patient information: 1. American Academy of Asthma, Allergy, and Immunology: www.aaaai.org 2. Food Allergy Research and Education (FARE): foodallergy.org 3. Mothers of Asthmatics: http://www.asthmacommunitynetwork.org 4. American College of Allergy, Asthma, and Immunology: MissingWeapons.ca   Make sure you  are registered to vote! If you have moved or changed any of your contact information, you will need to get this updated before voting!    Voter ID laws are POSSIBLY going into effect for the General Election in November 2020! Be prepared! Check out LandscapingDigest.dk for more details.      Reducing Pollen Exposure  The American Academy of Allergy, Asthma and Immunology suggests the following steps to reduce your exposure to pollen during allergy seasons.    1. Do not hang sheets or clothing out to dry; pollen may collect on these items. 2. Do not mow lawns or spend time around freshly cut grass; mowing stirs up pollen. 3. Keep windows closed at night.  Keep car windows closed while driving. 4. Minimize morning activities outdoors, a time when pollen counts are usually at their highest. 5. Stay indoors as much as possible when pollen counts or humidity is high and on windy days when pollen tends to remain in the air longer. 6. Use air conditioning when possible.  Many air conditioners have filters that trap the pollen spores. 7. Use a HEPA room air filter to remove pollen form the indoor air you breathe.  Control of Mold Allergen   Mold and fungi can grow on a variety of surfaces provided certain temperature and moisture conditions exist.  Outdoor molds grow on plants, decaying vegetation and soil.  The major outdoor mold, Alternaria and Cladosporium, are found in very high numbers during hot and dry conditions.  Generally, a late Summer - Fall peak is seen for common outdoor fungal spores.  Rain will temporarily lower outdoor mold spore count, but counts rise rapidly  when the rainy period ends.  The most important indoor molds are Aspergillus and Penicillium.  Dark, humid and poorly ventilated basements are ideal sites for mold growth.  The next most common sites of mold growth are the bathroom and the kitchen.  Outdoor (Seasonal) Mold Control  Positive outdoor molds via skin  testing: Alternaria, Cladosporium, Bipolaris (Helminthsporium), Drechslera (Curvalaria) and Mucor  1. Use air conditioning and keep windows closed 2. Avoid exposure to decaying vegetation. 3. Avoid leaf raking. 4. Avoid grain handling. 5. Consider wearing a face mask if working in moldy areas.  6.   Indoor (Perennial) Mold Control   Positive indoor molds via skin testing: Aspergillus, Penicillium, Fusarium, Aureobasidium (Pullulara) and Rhizopus  1. Maintain humidity below 50%. 2. Clean washable surfaces with 5% bleach solution. 3. Remove sources e.g. contaminated carpets.     Control of Dog or Cat Allergen  Avoidance is the best way to manage a dog or cat allergy. If you have a dog or cat and are allergic to dog or cats, consider removing the dog or cat from the home. If you have a dog or cat but don't want to find it a new home, or if your family wants a pet even though someone in the household is allergic, here are some strategies that may help keep symptoms at bay:  1. Keep the pet out of your bedroom and restrict it to only a few rooms. Be advised that keeping the dog or cat in only one room will not limit the allergens to that room. 2. Don't pet, hug or kiss the dog or cat; if you do, wash your hands with soap and water. 3. High-efficiency particulate air (HEPA) cleaners run continuously in a bedroom or living room can reduce allergen levels over time. 4. Regular use of a high-efficiency vacuum cleaner or a central vacuum can reduce allergen levels. 5. Giving your dog or cat a bath at least once a week can reduce airborne allergen.  Control of Cockroach Allergen  Cockroach allergen has been identified as an important cause of acute attacks of asthma, especially in urban settings.  There are fifty-five species of cockroach that exist in the [place], however only three, the [place], [place] and Oriental species produce allergen that can affect patients with Asthma.   Allergens can be obtained from fecal particles, egg casings and secretions from cockroaches.    1. Remove food sources. 2. Reduce access to water. 3. Seal access and entry points. 4. Spray runways with 0.5-1% Diazinon or Chlorpyrifos 5. Blow boric acid power under stoves and refrigerator. 6. Place bait stations (hydramethylnon) at feeding sites.  Allergy Shots   Allergies are the result of a chain reaction that starts in the immune system. Your immune system controls how your body defends itself. For instance, if you have an allergy to pollen, your immune system identifies pollen as an invader or allergen. Your immune system overreacts by producing antibodies called Immunoglobulin E (IgE). These antibodies travel to cells that release chemicals, causing an allergic reaction.  The concept behind allergy immunotherapy, whether it is received in the form of shots or tablets, is that the immune system can be desensitized to specific allergens that trigger allergy symptoms. Although it requires time and patience, the payback can be long-term relief.  How Do Allergy Shots Work?  Allergy shots work much like a vaccine. Your body responds to injected amounts of a particular allergen given in increasing doses, eventually developing a resistance and tolerance to  it. Allergy shots can lead to decreased, minimal or no allergy symptoms.  There generally are two phases: build-up and maintenance. Build-up often ranges from three to six months and involves receiving injections with increasing amounts of the allergens. The shots are typically given once or twice a week, though more rapid build-up schedules are sometimes used.  The maintenance phase begins when the most effective dose is reached. This dose is different for each person, depending on how allergic you are and your response to the build-up injections. Once the maintenance dose is reached, there are longer periods between injections, typically two to  four weeks.  Occasionally doctors give cortisone-type shots that can temporarily reduce allergy symptoms. These types of shots are different and should not be confused with allergy immunotherapy shots.  Who Can Be Treated with Allergy Shots?  Allergy shots may be a good treatment approach for people with allergic rhinitis (hay fever), allergic asthma, conjunctivitis (eye allergy) or stinging insect allergy.   Before deciding to begin allergy shots, you should consider:  . The length of allergy season and the severity of your symptoms . Whether medications and/or changes to your environment can control your symptoms . Your desire to avoid long-term medication use . Time: allergy immunotherapy requires a major time commitment . Cost: may vary depending on your insurance coverage  Allergy shots for children age 59 and older are effective and often well tolerated. They might prevent the onset of new allergen sensitivities or the progression to asthma.  Allergy shots are not started on patients who are pregnant but can be continued on patients who become pregnant while receiving them. In some patients with other medical conditions or who take certain common medications, allergy shots may be of risk. It is important to mention other medications you talk to your allergist.   When Will I Feel Better?  Some may experience decreased allergy symptoms during the build-up phase. For others, it may take as long as 12 months on the maintenance dose. If there is no improvement after a year of maintenance, your allergist will discuss other treatment options with you.  If you aren't responding to allergy shots, it may be because there is not enough dose of the allergen in your vaccine or there are missing allergens that were not identified during your allergy testing. Other reasons could be that there are high levels of the allergen in your environment or major exposure to non-allergic triggers like tobacco  smoke.  What Is the Length of Treatment?  Once the maintenance dose is reached, allergy shots are generally continued for three to five years. The decision to stop should be discussed with your allergist at that time. Some people may experience a permanent reduction of allergy symptoms. Others may relapse and a longer course of allergy shots can be considered.  What Are the Possible Reactions?  The two types of adverse reactions that can occur with allergy shots are local and systemic. Common local reactions include very mild redness and swelling at the injection site, which can happen immediately or several hours after. A systemic reaction, which is less common, affects the entire body or a particular body system. They are usually mild and typically respond quickly to medications. Signs include increased allergy symptoms such as sneezing, a stuffy nose or hives.  Rarely, a serious systemic reaction called anaphylaxis can develop. Symptoms include swelling in the throat, wheezing, a feeling of tightness in the chest, nausea or dizziness. Most serious systemic reactions develop within  30 minutes of allergy shots. This is why it is strongly recommended you wait in your doctor's office for 30 minutes after your injections. Your allergist is trained to watch for reactions, and his or her staff is trained and equipped with the proper medications to identify and treat them.  Who Should Administer Allergy Shots?  The preferred location for receiving shots is your prescribing allergist's office. Injections can sometimes be given at another facility where the physician and staff are trained to recognize and treat reactions, and have received instructions by your prescribing allergist.

## 2018-06-13 NOTE — Progress Notes (Signed)
NEW PATIENT  Date of Service/Encounter:  06/13/18  Referring provider: Bennie Pierini, FNP   Assessment:   Seasonal and perennial allergic rhinitis (horse, trees, weeds, grasses, indoor molds, outdoor molds, cat and cockroach)  Plan/Recommendations:    1. Seasonal and perennial allergic rhinitis - Testing today showed: horse, trees, weeds, grasses, indoor molds, outdoor molds, cat and cockroach  - We are going to send some labs to confirm the cattle allergy. - We will call you in 1-2 weeks with the results of the testing. - Copy of test results provided.  - Avoidance measures provided. - Start taking: Allegra (fexofenadine) 180mg  table once daily, Singulair (montelukast) 10mg  daily and Nasacort (triamcinolone) one spray per nostril daily - You can use an extra dose of the antihistamine, if needed, for breakthrough symptoms.  - Consider nasal saline rinses 1-2 times daily to remove allergens from the nasal cavities as well as help with mucous clearance (this is especially helpful to do before the nasal sprays are given) - Consider allergy shots as a means of long-term control. - Allergy shots "re-train" and "reset" the immune system to ignore environmental allergens and decrease the resulting immune response to those allergens (sneezing, itchy watery eyes, runny nose, nasal congestion, etc).    - Allergy shots improve symptoms in 75-85% of patients.  - We can discuss more at the next appointment if the medications are not working for you.  2. Return in about 3 months (around 09/11/2018).  Subjective:   Carrie Jordan is a 48 y.o. female presenting today for evaluation of  Chief Complaint  Patient presents with  . Allergy Testing  . Nasal Congestion  . Conjunctivitis    Carrie Jordan has a history of the following: Patient Active Problem List   Diagnosis Date Noted  . Subclinical hyperthyroidism 05/15/2018  . Allergic rhinitis 07/20/2013  . Pharyngitis 08/04/2012    . 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 Constipation, chronic 07/18/2011  . GERD (gastroesophageal reflux disease) 07/18/2011    History obtained from: chart review and patient.  Carrie Jordan was referred by Bennie Pierini, FNP.     Carrie Jordan is a 48 y.o. female presenting for a evaluation of a allergy symptoms. She tells me today that she has had a reaction in which she developed ocular swelling. She thinks that this is related to wearing some gloves that she had utilized when she was helping her son with moving a deer during hunting season.  Several years ago, she moved a deer from the road to the woods (this was a live baby deer). After moving the deer, she developed ocular swelling. After that through the years, she has had some problems when she touched animals. She does fine with horses, but she no longer has any horses at all. She did fine with horses. She has since reacted to cattle and deer.   This happens fairly rarely but she had put on this same pair of gloves in her laundry room. She was helping her son in law pick up a deer and she touched her eye. This reaction occurred around two weeks ago. She was treated with prednisone for 3-4 days.    Around the time, she was getting rid of her chickens. During that time, she was getting hoarse and felt that her throat was swelling. She does not have problems with chickens otherwise, and she thinks that the reaction occurred due to the dander on the gloves.  Allergic Rhinitis Symptom History: She does have some sneezing and itchy wsatery eyes when she sneezes. She has never been allergy tested in the past. This is typically in the spring and the summer when the "pollen gets thick". She reports that she will have a sinus headache and URI symptoms. She does take an allergy pill when this happens. She has never been on nasal sprays aside from saline.    Otherwise, there is no history of other atopic  diseases, including asthma, food allergies, drug allergies, stinging insect allergies, eczema, urticaria or contact dermatitis. There is no significant infectious history. Vaccinations are up to date.    Past Medical History: Patient Active Problem List   Diagnosis Date Noted  . Subclinical hyperthyroidism 05/15/2018  . Allergic rhinitis 07/20/2013  . Pharyngitis 08/04/2012  . 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 Constipation, chronic 07/18/2011  . GERD (gastroesophageal reflux disease) 07/18/2011    Medication List:  Allergies as of 06/13/2018      Reactions   Codeine Nausea And Vomiting      Medication List       Accurate as of June 13, 2018 10:47 PM. Always use your most recent med list.        levocetirizine 5 MG tablet Commonly known as:  XYZAL Take 5 mg by mouth every evening.   montelukast 10 MG tablet Commonly known as:  SINGULAIR Take 1 tablet (10 mg total) by mouth at bedtime.   omeprazole 20 MG capsule Commonly known as:  PRILOSEC Take 1 capsule (20 mg total) by mouth daily.   SUMAtriptan 100 MG tablet Commonly known as:  IMITREX Take 1 tablet (100 mg total) by mouth every 2 (two) hours as needed for migraine.       Birth History: non-contributory  Developmental History: non-contributory.   Past Surgical History: Past Surgical History:  Procedure Laterality Date  . APPENDECTOMY    . TUBAL LIGATION       Family History: Family History  Problem Relation Age of Onset  . Thyroid disease Mother   . Allergic rhinitis Sister   . Colon cancer Maternal Grandmother 60  . Cancer Paternal Grandmother        ?blood     Social History: Kylianna lives at home with her family.  She lives in a house that is 50 years old.  There is hardwood in the main living areas and laminate in the bedrooms.  They have electric heating and central cooling.  There is 1 dog inside the home.  She does have dust mite covers on the  bed, but not the pillows.  There is no tobacco exposure.  She currently works as a Warden/ranger at [employer].  She did smoke from 24 through 2010.   Review of Systems  Constitutional: Negative.  Negative for fever, malaise/fatigue and weight loss.  HENT: Positive for congestion and sinus pain. Negative for ear discharge and ear pain.   Eyes: Negative for pain, discharge and redness.  Respiratory: Positive for cough. Negative for sputum production, shortness of breath and wheezing.   Cardiovascular: Negative.  Negative for chest pain and palpitations.  Gastrointestinal: Negative for abdominal pain and heartburn.  Skin: Negative.  Negative for itching and rash.  Neurological: Negative for dizziness and headaches.  Endo/Heme/Allergies: Negative for environmental allergies. Does not bruise/bleed easily.       Objective:   Blood pressure 122/80, pulse 70, temperature 98 F (36.7 C), temperature  source Oral, resp. rate 18, height 5' 7.5" (1.715 m), weight 192 lb 9.6 oz (87.4 kg), SpO2 97 %. Body mass index is 29.72 kg/m.   Physical Exam:   Physical Exam  Constitutional: She appears well-developed.  HENT:  Head: Normocephalic and atraumatic.  Right Ear: Tympanic membrane, external ear and ear canal normal. No drainage, swelling or tenderness. Tympanic membrane is not injected, not scarred, not erythematous, not retracted and not bulging.  Left Ear: Tympanic membrane, external ear and ear canal normal. No drainage, swelling or tenderness. Tympanic membrane is not injected, not scarred, not erythematous, not retracted and not bulging.  Nose: Mucosal edema and rhinorrhea present. No nasal deformity or septal deviation. Epistaxis is observed. Right sinus exhibits no maxillary sinus tenderness and no frontal sinus tenderness. Left sinus exhibits no maxillary sinus tenderness and no frontal sinus tenderness.  Mouth/Throat: Uvula is midline and oropharynx is clear and  moist. Mucous membranes are not pale and not dry.  Eyes: Pupils are equal, round, and reactive to light. Conjunctivae and EOM are normal. Right eye exhibits no chemosis and no discharge. Left eye exhibits no chemosis and no discharge. Right conjunctiva is not injected. Left conjunctiva is not injected.  Cardiovascular: Normal rate, regular rhythm and normal heart sounds.  Respiratory: Effort normal and breath sounds normal. No accessory muscle usage. No tachypnea. No respiratory distress. She has no wheezes. She has no rhonchi. She has no rales. She exhibits no tenderness.  GI: There is no abdominal tenderness. There is no rebound and no guarding.  Lymphadenopathy:       Head (right side): No submandibular, no tonsillar and no occipital adenopathy present.       Head (left side): No submandibular, no tonsillar and no occipital adenopathy present.    She has no cervical adenopathy.  Neurological: She is alert.  Skin: No abrasion, no petechiae and no rash noted. Rash is not papular, not vesicular and not urticarial. No erythema. No pallor.  Psychiatric: She has a normal mood and affect.     Diagnostic studies:    Allergy Studies:    Airborne Adult Perc - 06/13/18 1418    Time Antigen Placed  1418    Allergen Manufacturer  Waynette ButteryGreer    Location  Back    Number of Test  59    Panel 1  Select    1. Control-Buffer 50% Glycerol  Negative    2. Control-Histamine 1 mg/ml  2+    3. Albumin saline  Negative    4. Bahia  Negative    5. [place] Southern [place]  Negative    6. Johnson  Negative    7. Kentucky Blue  Negative    8. Meadow Fescue  Negative    9. Perennial Rye  Negative    10. Sweet Vernal  Negative    11. Timothy  Negative    12. Cocklebur  Negative    13. Burweed Marshelder  Negative    14. Ragweed, short  Negative    15. Ragweed, Giant  Negative    16. Plantain,  English  Negative    17. Lamb's Quarters  Negative    18. Sheep Sorrell  Negative    19. Rough Pigweed  Negative    20. Marsh  Elder, Rough  Negative    21. Mugwort, Common  Negative    22. Ash mix  Negative    23. Birch mix  Negative    24. Beech American  Negative    25. Box,  Elder  Negative    26. Cedar, red  Negative    27. Cottonwood, Guinea-Bissau  Negative    28. Elm mix  Negative    29. Hickory mix  Negative    30. Maple mix  Negative    31. Oak, Guinea-Bissau mix  Negative    32. Pecan Pollen  Negative    33. Pine mix  Negative    34. Sycamore Eastern  Negative    35. Walnut, Black Pollen  Negative    36. Alternaria alternata  Negative    37. Cladosporium Herbarum  Negative    38. Aspergillus mix  Negative    39. Penicillium mix  Negative    40. Bipolaris sorokiniana (Helminthosporium)  Negative    41. Drechslera spicifera (Curvularia)  Negative    42. Mucor plumbeus  Negative    43. Fusarium moniliforme  Negative    44. Aureobasidium pullulans (pullulara)  Negative    45. Rhizopus oryzae  Negative    46. Botrytis cinera  Negative    47. Epicoccum nigrum  Negative    48. Phoma betae  Negative    49. Candida Albicans  Negative    50. Trichophyton mentagrophytes  Negative    51. Mite, D Farinae  5,000 AU/ml  Negative    52. Mite, D Pteronyssinus  5,000 AU/ml  Negative    53. Cat Hair 10,000 BAU/ml  Negative    54.  Dog Epithelia  Negative    55. Mixed Feathers  Negative    56. Horse Epithelia  3+    57. Cockroach, German  Negative    58. Mouse  Negative    59. Tobacco Leaf  Negative     Intradermal - 06/13/18 1448    Time Antigen Placed  1448    Allergen Manufacturer  Waynette Buttery    Location  Arm    Number of Test  15    Intradermal  Select    Control  Negative    [place]  2+    Johnson  3+    7 Grass  2+    Ragweed mix  3+    Weed mix  3+    Tree mix  3+    Mold 1  2+    Mold 2  3+    Mold 3  3+    Mold 4  3+    Cat  3+    Dog  Negative    Cockroach  1+    Mite mix  Negative        Allergy testing results were read and interpreted by myself, documented by clinical staff.         Malachi Bonds, MD [facility]

## 2018-06-16 LAB — E004-IGE COW DANDER: Cow Dander IgE: >100 kU/L — AB

## 2018-08-15 ENCOUNTER — Ambulatory Visit: Payer: BC Managed Care – PPO | Admitting: "Endocrinology

## 2018-09-12 ENCOUNTER — Ambulatory Visit: Payer: BC Managed Care – PPO | Admitting: Allergy & Immunology

## 2018-09-16 ENCOUNTER — Telehealth: Payer: Self-pay | Admitting: Nurse Practitioner

## 2018-09-16 ENCOUNTER — Encounter (INDEPENDENT_AMBULATORY_CARE_PROVIDER_SITE_OTHER): Payer: Self-pay

## 2018-10-14 LAB — HM MAMMOGRAPHY

## 2018-10-21 LAB — HM PAP SMEAR: HM Pap smear: NEGATIVE

## 2019-04-08 ENCOUNTER — Other Ambulatory Visit: Payer: Self-pay | Admitting: *Deleted

## 2019-04-08 DIAGNOSIS — K219 Gastro-esophageal reflux disease without esophagitis: Secondary | ICD-10-CM

## 2019-04-08 NOTE — Telephone Encounter (Signed)
MMM. NTBS LOV 03/10/18

## 2019-10-20 ENCOUNTER — Ambulatory Visit: Payer: BC Managed Care – PPO | Admitting: Nurse Practitioner

## 2019-10-20 ENCOUNTER — Other Ambulatory Visit: Payer: Self-pay

## 2019-10-20 ENCOUNTER — Encounter: Payer: Self-pay | Admitting: Nurse Practitioner

## 2019-10-20 VITALS — BP 136/88 | HR 74 | Temp 97.9°F | Resp 20 | Ht 67.0 in | Wt 161.0 lb

## 2019-10-20 DIAGNOSIS — J301 Allergic rhinitis due to pollen: Secondary | ICD-10-CM

## 2019-10-20 DIAGNOSIS — Z6830 Body mass index (BMI) 30.0-30.9, adult: Secondary | ICD-10-CM | POA: Insufficient documentation

## 2019-10-20 DIAGNOSIS — Z6825 Body mass index (BMI) 25.0-25.9, adult: Secondary | ICD-10-CM

## 2019-10-20 DIAGNOSIS — E0590 Thyrotoxicosis, unspecified without thyrotoxic crisis or storm: Secondary | ICD-10-CM | POA: Diagnosis not present

## 2019-10-20 DIAGNOSIS — K2100 Gastro-esophageal reflux disease with esophagitis, without bleeding: Secondary | ICD-10-CM | POA: Diagnosis not present

## 2019-10-20 MED ORDER — LEVOCETIRIZINE DIHYDROCHLORIDE 5 MG PO TABS: 5.0000 mg | ORAL_TABLET | Freq: Every evening | ORAL | 6 refills | Status: DC

## 2019-10-20 MED ORDER — MONTELUKAST SODIUM 10 MG PO TABS: 10.0000 mg | ORAL_TABLET | Freq: Every day | ORAL | 5 refills | Status: DC

## 2019-10-20 MED ORDER — OMEPRAZOLE 20 MG PO CPDR: 20.0000 mg | DELAYED_RELEASE_CAPSULE | Freq: Every day | ORAL | 11 refills | Status: DC

## 2019-10-20 NOTE — Progress Notes (Signed)
Subjective:    Patient ID: Carrie Jordan, female    DOB: 1970/11/06, 49 y.o.   MRN: [id number]   Chief Complaint: Medical Management of Chronic Issues    HPI:  1. Gastroesophageal reflux disease with esophagitis without hemorrhage Is on omeprazole daily and is doing well.  2. Subclinical hyperthyroidism Is not on any meds. Has no had labs checked in awhile. She denies heart racing or SOB. Lab Results  Component Value Date   TSH 0.378 (L) 03/20/2018     3. Seasonal allergic rhinitis due to pollen She has seasonal allergies in which she takes xyzal and singulair. She is doing well right now   4. BMI 30.0-30.9 Weight is down 31 lbs  Wt Readings from Last 3 Encounters:  10/20/19 161 lb (73 kg)  06/13/18 192 lb 9.6 oz (87.4 kg)  06/06/18 192 lb (87.1 kg)   BMI Readings from Last 3 Encounters:  10/20/19 25.22 kg/m  06/13/18 29.72 kg/m  06/06/18 30.07 kg/m       Outpatient Encounter Medications as of 10/20/2019  Medication Sig  . levocetirizine (XYZAL) 5 MG tablet Take 5 mg by mouth every evening.  . montelukast (SINGULAIR) 10 MG tablet Take 1 tablet (10 mg total) by mouth at bedtime.  Marland Kitchen omeprazole (PRILOSEC) 20 MG capsule Take 1 capsule (20 mg total) by mouth daily.  . SUMAtriptan (IMITREX) 100 MG tablet Take 1 tablet (100 mg total) by mouth every 2 (two) hours as needed for migraine.     Past Surgical History:  Procedure Laterality Date  . APPENDECTOMY    . TUBAL LIGATION      Family History  Problem Relation Age of Onset  . Thyroid disease Mother   . Allergic rhinitis Sister   . Colon cancer Maternal Grandmother 40  . Cancer Paternal Grandmother        ?blood    New complaints: None today  Social history: Lives with  Husband- family owns [address] [place]  Controlled substance contract: n/a    Review of Systems  Constitutional: Negative for diaphoresis.  Eyes: Negative for pain.  Respiratory: Negative for shortness of breath.     Cardiovascular: Negative for chest pain, palpitations and leg swelling.  Gastrointestinal: Negative for abdominal pain.  Endocrine: Negative for polydipsia.  Skin: Negative for rash.  Neurological: Negative for dizziness, weakness and headaches.  Hematological: Does not bruise/bleed easily.  All other systems reviewed and are negative.      Objective:   Physical Exam Vitals and nursing note reviewed.  Constitutional:      General: She is not in acute distress.    Appearance: Normal appearance. She is well-developed.  HENT:     Head: Normocephalic.     Nose: Nose normal.  Eyes:     Pupils: Pupils are equal, round, and reactive to light.  Neck:     Vascular: No carotid bruit or JVD.  Cardiovascular:     Rate and Rhythm: Normal rate and regular rhythm.     Heart sounds: Normal heart sounds.  Pulmonary:     Effort: Pulmonary effort is normal. No respiratory distress.     Breath sounds: Normal breath sounds. No wheezing or rales.  Chest:     Chest wall: No tenderness.  Abdominal:     General: Bowel sounds are normal. There is no distension or abdominal bruit.     Palpations: Abdomen is soft. There is no hepatomegaly, splenomegaly, mass or pulsatile mass.     Tenderness: There is  no abdominal tenderness.  Musculoskeletal:        General: Normal range of motion.     Cervical back: Normal range of motion and neck supple.  Lymphadenopathy:     Cervical: No cervical adenopathy.  Skin:    General: Skin is warm and dry.  Neurological:     Mental Status: She is alert and oriented to person, place, and time.     Deep Tendon Reflexes: Reflexes are normal and symmetric.  Psychiatric:        Behavior: Behavior normal.        Thought Content: Thought content normal.        Judgment: Judgment normal.    BP 136/88 (BP Location: Left Arm, Cuff Size: Normal)   Pulse 74   Temp 97.9 F (36.6 C) (Temporal)   Resp 20   Ht '5\' 7"'  (1.702 m)   Wt 161 lb (73 kg)   SpO2 99%   BMI 25.22  kg/m          Assessment & Plan:  Cheyeanne Roadcap comes in today with chief complaint of Medical Management of Chronic Issues   Diagnosis and orders addressed:  1. Gastroesophageal reflux disease with esophagitis without hemorrhage Avoid spicy foods Do not eat 2 hours prior to bedtime - omeprazole (PRILOSEC) 20 MG capsule; Take 1 capsule (20 mg total) by mouth daily.  Dispense: 30 capsule; Refill: 1 - CBC with Differential/Platelet - CMP14+EGFR - Thyroid Panel With TSH - Lipid panel  2. Subclinical hyperthyroidism Labs pending  3. Seasonal allergic rhinitis due to pollen - montelukast (SINGULAIR) 10 MG tablet; Take 1 tablet (10 mg total) by mouth at bedtime.  Dispense: 30 tablet; Refill: 5 - levocetirizine (XYZAL) 5 MG tablet; Take 1 tablet (5 mg total) by mouth every evening.  Dispense: 30 tablet; Refill: 6  4. BMI 25.0-25.9,adult Discussed diet and exercise for person with BMI >25 Will recheck weight in 3-6 months   Labs pending Health Maintenance reviewed Diet and exercise encouraged  Follow up plan: prn   Mary-Margaret Hassell Done, FNP

## 2019-10-20 NOTE — Patient Instructions (Signed)

## 2019-10-21 LAB — LIPID PANEL
Chol/HDL Ratio: 5.6 ratio — ABNORMAL HIGH (ref 0.0–4.4)
Cholesterol, Total: 263 mg/dL — ABNORMAL HIGH (ref 100–199)
HDL: 47 mg/dL (ref >39)
LDL Chol Calc (NIH): 194 mg/dL — ABNORMAL HIGH (ref 0–99)
Triglycerides: 121 mg/dL (ref 0–149)
VLDL Cholesterol Cal: 22 mg/dL (ref 5–40)

## 2019-10-21 LAB — CMP14+EGFR
ALT: 11 IU/L (ref 0–32)
AST: 13 IU/L (ref 0–40)
Albumin/Globulin Ratio: 1.8 (ref 1.2–2.2)
Albumin: 4.4 g/dL (ref 3.8–4.8)
Alkaline Phosphatase: 118 IU/L (ref 48–121)
BUN/Creatinine Ratio: 16 (ref 9–23)
BUN: 11 mg/dL (ref 6–24)
Bilirubin Total: <0.2 mg/dL (ref 0.0–1.2)
CO2: 26 mmol/L (ref 20–29)
Calcium: 9.6 mg/dL (ref 8.7–10.2)
Chloride: 100 mmol/L (ref 96–106)
Creatinine, Ser: 0.7 mg/dL (ref 0.57–1.00)
GFR calc Af Amer: 117 mL/min/{1.73_m2} (ref >59)
GFR calc non Af Amer: 101 mL/min/{1.73_m2} (ref >59)
Globulin, Total: 2.4 g/dL (ref 1.5–4.5)
Glucose: 100 mg/dL — ABNORMAL HIGH (ref 65–99)
Potassium: 4.1 mmol/L (ref 3.5–5.2)
Sodium: 140 mmol/L (ref 134–144)
Total Protein: 6.8 g/dL (ref 6.0–8.5)

## 2019-10-21 LAB — CBC WITH DIFFERENTIAL/PLATELET
Basophils Absolute: 0.1 10*3/uL (ref 0.0–0.2)
Basos: 1 %
EOS (ABSOLUTE): 0.2 10*3/uL (ref 0.0–0.4)
Eos: 2 %
Hematocrit: 37.2 % (ref 34.0–46.6)
Hemoglobin: 12.7 g/dL (ref 11.1–15.9)
Immature Grans (Abs): 0 10*3/uL (ref 0.0–0.1)
Immature Granulocytes: 0 %
Lymphocytes Absolute: 3.3 10*3/uL — ABNORMAL HIGH (ref 0.7–3.1)
Lymphs: 33 %
MCH: 30.8 pg (ref 26.6–33.0)
MCHC: 34.1 g/dL (ref 31.5–35.7)
MCV: 90 fL (ref 79–97)
Monocytes Absolute: 0.5 10*3/uL (ref 0.1–0.9)
Monocytes: 5 %
Neutrophils Absolute: 5.9 10*3/uL (ref 1.4–7.0)
Neutrophils: 59 %
Platelets: 371 10*3/uL (ref 150–450)
RBC: 4.12 x10E6/uL (ref 3.77–5.28)
RDW: 13.3 % (ref 11.7–15.4)
WBC: 10 10*3/uL (ref 3.4–10.8)

## 2019-10-21 LAB — THYROID PANEL WITH TSH
Free Thyroxine Index: 1.8 (ref 1.2–4.9)
T3 Uptake Ratio: 23 % — ABNORMAL LOW (ref 24–39)
T4, Total: 7.7 ug/dL (ref 4.5–12.0)
TSH: 1.51 u[IU]/mL (ref 0.450–4.500)

## 2019-10-26 MED ORDER — ROSUVASTATIN CALCIUM 10 MG PO TABS: 10.0000 mg | ORAL_TABLET | Freq: Every day | ORAL | 1 refills | Status: DC

## 2019-10-26 NOTE — Addendum Note (Signed)
Addended by: Bennie Pierini on: 10/26/2019 01:36 PM   Modules accepted: Orders

## 2019-11-13 ENCOUNTER — Other Ambulatory Visit: Payer: Self-pay | Admitting: Nurse Practitioner

## 2019-11-13 DIAGNOSIS — Z1231 Encounter for screening mammogram for malignant neoplasm of breast: Secondary | ICD-10-CM

## 2019-12-23 ENCOUNTER — Ambulatory Visit
Admission: RE | Admit: 2019-12-23 | Discharge: 2019-12-23 | Disposition: A | Discharge status: Home / Self Care | Payer: BC Managed Care – PPO | Source: Ambulatory Visit | Attending: Nurse Practitioner | Admitting: Nurse Practitioner

## 2019-12-23 ENCOUNTER — Other Ambulatory Visit: Payer: Self-pay

## 2019-12-23 DIAGNOSIS — Z1231 Encounter for screening mammogram for malignant neoplasm of breast: Secondary | ICD-10-CM

## 2020-01-18 ENCOUNTER — Telehealth: Payer: Self-pay | Admitting: Nurse Practitioner

## 2020-01-18 NOTE — Telephone Encounter (Signed)
Pt says that she just needs an apt for labs to recheck cholesterol levels. I did not see in lab notes. Can she come in for labs or needs an apt with MMM. Please call back

## 2020-01-18 NOTE — Telephone Encounter (Signed)
Appointment scheduled.

## 2020-01-18 NOTE — Telephone Encounter (Signed)
Patient's last labs done 10/20/2019 included lipid, thyroid, CMP and CBC.  Lipids abnormal and patient started on Crestor.  Please review and advise when the patient will be due for labs and which ones are needed.

## 2020-01-18 NOTE — Telephone Encounter (Signed)
Folllow up is due now- please make an appointment

## 2020-01-26 ENCOUNTER — Encounter: Payer: Self-pay | Admitting: Nurse Practitioner

## 2020-01-26 ENCOUNTER — Other Ambulatory Visit: Payer: Self-pay

## 2020-01-26 ENCOUNTER — Ambulatory Visit: Payer: BC Managed Care – PPO | Admitting: Nurse Practitioner

## 2020-01-26 VITALS — BP 145/92 | HR 79 | Temp 98.2°F | Resp 20 | Ht 67.0 in | Wt 162.0 lb

## 2020-01-26 DIAGNOSIS — K2100 Gastro-esophageal reflux disease with esophagitis, without bleeding: Secondary | ICD-10-CM | POA: Diagnosis not present

## 2020-01-26 DIAGNOSIS — F458 Other somatoform disorders: Secondary | ICD-10-CM | POA: Diagnosis not present

## 2020-01-26 DIAGNOSIS — J301 Allergic rhinitis due to pollen: Secondary | ICD-10-CM | POA: Diagnosis not present

## 2020-01-26 DIAGNOSIS — Z6830 Body mass index (BMI) 30.0-30.9, adult: Secondary | ICD-10-CM

## 2020-01-26 DIAGNOSIS — Z1159 Encounter for screening for other viral diseases: Secondary | ICD-10-CM

## 2020-01-26 MED ORDER — CYCLOBENZAPRINE HCL 5 MG PO TABS: 5.0000 mg | ORAL_TABLET | Freq: Every day | ORAL | 1 refills | Status: DC

## 2020-01-26 NOTE — Progress Notes (Signed)
Subjective:    Patient ID: Carrie Jordan, female    DOB: November 25, 1970, 49 y.o.   MRN: [id number]  Chief Complaint: No chief complaint on file.    HPI:  1. Gastroesophageal reflux disease with esophagitis without hemorrhage Eats a lot of spicy foods, does not take medication every day. Takes every other day or sometimes every 3 days. Symptoms relieved when on medication.   2. Seasonal allergic rhinitis due to pollen C/o sinus headache after mowing the grass. Has follow up with allergist, states she is allergic too many grasses/seeds and animals. Takes medication seasonally.   3. BMI 30.0-30.9,adult Wt Readings from Last 3 Encounters:  01/26/20 162 lb (73.5 kg)  10/20/19 161 lb (73 kg)  06/13/18 192 lb 9.6 oz (87.4 kg)   BMI Readings from Last 3 Encounters:  01/26/20 25.37 kg/m  10/20/19 25.22 kg/m  06/13/18 29.72 kg/m   Works frequently, is on feet often.    Outpatient Encounter Medications as of 01/26/2020  Medication Sig   levocetirizine (XYZAL) 5 MG tablet Take 1 tablet (5 mg total) by mouth every evening.   montelukast (SINGULAIR) 10 MG tablet Take 1 tablet (10 mg total) by mouth at bedtime.   omeprazole (PRILOSEC) 20 MG capsule Take 1 capsule (20 mg total) by mouth daily.   rosuvastatin (CRESTOR) 10 MG tablet Take 1 tablet (10 mg total) by mouth daily.   SUMAtriptan (IMITREX) 100 MG tablet Take 1 tablet (100 mg total) by mouth every 2 (two) hours as needed for migraine.     Past Surgical History:  Procedure Laterality Date   APPENDECTOMY     TUBAL LIGATION      Family History  Problem Relation Age of Onset   Thyroid disease Mother    Allergic rhinitis Sister    Colon cancer Maternal Grandmother 64   Cancer Paternal Grandmother        ?blood    New complaints: Has increased anxiety due to stress of job during last month, c/o increased jaw tension and pain due to clenching.   Social history: Lives with husband and daughter, [place]   Controlled substance contract: n/a    Review of Systems  Constitutional: Negative.   HENT: Negative.   Eyes: Negative.   Respiratory: Negative.   Cardiovascular: Negative.   Gastrointestinal: Negative.   Endocrine: Negative.   Genitourinary: Negative.   Musculoskeletal: Negative.   Skin: Negative.   Allergic/Immunologic: Negative.   Neurological: Negative.   Hematological: Negative.   Psychiatric/Behavioral: Negative.   All other systems reviewed and are negative.      Objective:   Physical Exam Constitutional:      Appearance: Normal appearance.  HENT:     Head: Normocephalic and atraumatic.     Right Ear: Tympanic membrane, ear canal and external ear normal.     Left Ear: Tympanic membrane, ear canal and external ear normal.     Nose: Nose normal.     Mouth/Throat:     Mouth: Mucous membranes are moist.     Pharynx: Oropharynx is clear.  Eyes:     Extraocular Movements: Extraocular movements intact.     Conjunctiva/sclera: Conjunctivae normal.     Pupils: Pupils are equal, round, and reactive to light.  Cardiovascular:     Rate and Rhythm: Normal rate and regular rhythm.     Pulses: Normal pulses.     Heart sounds: Normal heart sounds.  Pulmonary:     Effort: Pulmonary effort is normal.  Breath sounds: Normal breath sounds.  Abdominal:     General: Bowel sounds are normal.  Musculoskeletal:        General: Normal range of motion.     Cervical back: Normal range of motion.  Skin:    General: Skin is warm and dry.     Capillary Refill: Capillary refill takes less than 2 seconds.  Neurological:     General: No focal deficit present.     Mental Status: She is alert and oriented to person, place, and time. Mental status is at baseline.  Psychiatric:        Mood and Affect: Mood normal.        Behavior: Behavior normal.        Thought Content: Thought content normal.        Judgment: Judgment normal.    BP (!) 145/92    Pulse 79    Temp 98.2 F  (36.8 C) (Temporal)    Resp 20    Ht '5\' 7"'  (1.702 m)    Wt 162 lb (73.5 kg)    LMP 01/07/2017    SpO2 99%    BMI 25.37 kg/m       Assessment & Plan:  Carrie Jordan comes in today with chief complaint of No chief complaint on file.   Diagnosis and orders addressed:  1. Gastroesophageal reflux disease with esophagitis without hemorrhage Take medication as prescribed. Eat smaller meals more frequently verses large meals. Do not lie down after meals. Avoid trigger foods.   2. Seasonal allergic rhinitis due to pollen Take medication as needed. Avoid seasonal triggers if possible.   3. BMI 30.0-30.9,adult Exercise regularly. Walking and swimming are great cardiovascular exercises that help keep weight within a normal limit.   4. Jaw pain Flexeril 66m qhs Moist heat Avoid foods that require a lot of chewing Mouth guard at night  Meds ordered this encounter  Medications   cyclobenzaprine (FLEXERIL) 5 MG tablet    Sig: Take 1 tablet (5 mg total) by mouth at bedtime.    Dispense:  30 tablet    Refill:  1    Order Specific Question:   Supervising Provider    Answer:   DCaryl PinaA [[[id number]]  Orders Placed This Encounter  Procedures   CBC with Differential/Platelet   CMP14+EGFR   Lipid panel   VITAMIN D 25 Hydroxy (Vit-D Deficiency, Fractures)   Hepatitis C antibody    Labs pending Health Maintenance reviewed Diet and exercise encouraged  Follow up plan: Follow up in 3 months.    Mary-Margaret MHassell Done FNP

## 2020-01-26 NOTE — Patient Instructions (Signed)
Managing Stress, Adult Feeling a certain amount of stress is normal. Stress helps our body and mind get ready to deal with the demands of life. Stress hormones can motivate you to do well at work and meet your responsibilities. However severe or long-lasting (chronic) stress can affect your mental and physical health. Chronic stress puts you at higher risk for anxiety, depression, and other health problems like digestive problems, muscle aches, heart disease, high blood pressure, and stroke. What are the causes? Common causes of stress include:  Demands from work, such as deadlines, feeling overworked, or having long hours.  Pressures at home, such as money issues, disagreements with a spouse, or parenting issues.  Pressures from major life changes, such as divorce, moving, loss of a loved one, or chronic illness. You may be at higher risk for stress-related problems if you do not get enough sleep, are in poor health, do not have emotional support, or have a mental health disorder like anxiety or depression. How to recognize stress Stress can make you:  Have trouble sleeping.  Feel sad, anxious, irritable, or overwhelmed.  Lose your appetite.  Overeat or want to eat unhealthy foods.  Want to use drugs or alcohol. Stress can also cause physical symptoms, such as:  Sore, tense muscles, especially in the shoulders and neck.  Headaches.  Trouble breathing.  A faster heart rate.  Stomach pain, nausea, or vomiting.  Diarrhea or constipation.  Trouble concentrating. Follow these instructions at home: Lifestyle  Identify the source of your stress and your reaction to it. See a therapist who can help you change your reactions.  When there are stressful events: ? Talk about it with family, friends, or co-workers. ? Try to think realistically about stressful events and not ignore them or overreact. ? Try to find the positives in a stressful situation and not focus on the  negatives. ? Cut back on responsibilities at work and home, if possible. Ask for help from friends or family members if you need it.  Find ways to cope with stress, such as: ? Meditation. ? Deep breathing. ? Yoga or tai chi. ? Progressive muscle relaxation. ? Doing art, playing music, or reading. ? Making time for fun activities. ? Spending time with family and friends.  Get support from family, friends, or spiritual resources. Eating and drinking  Eat a healthy diet. This includes: ? Eating foods that are high in fiber, such as beans, whole grains, and fresh fruits and vegetables. ? Limiting foods that are high in fat and processed sugars, such as fried and sweet foods.  Do not skip meals or overeat.  Drink enough fluid to keep your urine pale yellow. Alcohol use  Do not drink alcohol if: ? Your health care provider tells you not to drink. ? You are pregnant, may be pregnant, or are planning to become pregnant.  Drinking alcohol is a way some people try to ease their stress. This can be dangerous, so if you drink alcohol: ? Limit how much you use to:  0-1 drink a day for women.  0-2 drinks a day for men. ? Be aware of how much alcohol is in your drink. In the [place] one drink equals one 12 oz bottle of beer (355 mL), one 5 oz glass of wine (148 mL), or one 1 oz glass of hard liquor (44 mL). Activity   Include 30 minutes of exercise in your daily schedule. Exercise is a good stress reducer.  Include time in your day   for an activity that you find relaxing. Try taking a walk, going on a bike ride, reading a book, or listening to music.  Schedule your time in a way that lowers stress, and keep a consistent schedule. Prioritize what is most important to get done. General instructions  Get enough sleep. Try to go to sleep and get up at about the same time every day.  Take over-the-counter and prescription medicines only as told by your health care provider.  Do not use any  products that contain nicotine or tobacco, such as cigarettes, e-cigarettes, and chewing tobacco. If you need help quitting, ask your health care provider.  Do not use drugs or smoke to cope with stress.  Keep all follow-up visits as told by your health care provider. This is important. Where to find support  Talk with your health care provider about stress management or finding a support group.  Find a therapist to work with you on your stress management techniques. Contact a health care provider if:  Your stress symptoms get worse.  You are unable to manage your stress at home.  You are struggling to stop using drugs or alcohol. Get help right away if:  You may be a danger to yourself or others.  You have any thoughts of death or suicide. If you ever feel like you may hurt yourself or others, or have thoughts about taking your own life, get help right away. You can go to your nearest emergency department or call:  Your local emergency services (911 in the [place]  A suicide crisis helpline, such as the [place] at 786-499-0900. This is open 24 hours a day. Summary  Feeling a certain amount of stress is normal, but severe or long-lasting (chronic) stress can affect your mental and physical health.  Chronic stress can put you at higher risk for anxiety, depression, and other health problems like digestive problems, muscle aches, heart disease, high blood pressure, and stroke.  You may be at higher risk for stress-related problems if you do not get enough sleep, are in poor health, lack emotional support, or have a mental health disorder like anxiety or depression.  Identify the source of your stress and your reaction to it. Try talking about stressful events with family, friends, or co-workers, finding a coping method, or getting support from spiritual resources.  If you need more help, talk with your health care provider about finding a support group  or a mental health therapist. This information is not intended to replace advice given to you by your health care provider. Make sure you discuss any questions you have with your health care provider. Document Revised: 11/12/2018 Document Reviewed: 11/12/2018 Elsevier Patient Education  [place].

## 2020-01-27 LAB — CBC WITH DIFFERENTIAL/PLATELET
Basophils Absolute: 0.1 10*3/uL (ref 0.0–0.2)
Basos: 1 %
EOS (ABSOLUTE): 0.1 10*3/uL (ref 0.0–0.4)
Eos: 1 %
Hematocrit: 39.9 % (ref 34.0–46.6)
Hemoglobin: 13.6 g/dL (ref 11.1–15.9)
Immature Grans (Abs): 0 10*3/uL (ref 0.0–0.1)
Immature Granulocytes: 0 %
Lymphocytes Absolute: 3 10*3/uL (ref 0.7–3.1)
Lymphs: 32 %
MCH: 31 pg (ref 26.6–33.0)
MCHC: 34.1 g/dL (ref 31.5–35.7)
MCV: 91 fL (ref 79–97)
Monocytes Absolute: 0.5 10*3/uL (ref 0.1–0.9)
Monocytes: 5 %
Neutrophils Absolute: 5.7 10*3/uL (ref 1.4–7.0)
Neutrophils: 61 %
Platelets: 350 10*3/uL (ref 150–450)
RBC: 4.39 x10E6/uL (ref 3.77–5.28)
RDW: 13.7 % (ref 11.7–15.4)
WBC: 9.5 10*3/uL (ref 3.4–10.8)

## 2020-01-27 LAB — LIPID PANEL
Chol/HDL Ratio: 5.7 ratio — ABNORMAL HIGH (ref 0.0–4.4)
Cholesterol, Total: 293 mg/dL — ABNORMAL HIGH (ref 100–199)
HDL: 51 mg/dL (ref >39)
LDL Chol Calc (NIH): 218 mg/dL — ABNORMAL HIGH (ref 0–99)
Triglycerides: 129 mg/dL (ref 0–149)
VLDL Cholesterol Cal: 24 mg/dL (ref 5–40)

## 2020-01-27 LAB — CMP14+EGFR
ALT: 13 IU/L (ref 0–32)
AST: 18 IU/L (ref 0–40)
Albumin/Globulin Ratio: 1.9 (ref 1.2–2.2)
Albumin: 4.7 g/dL (ref 3.8–4.8)
Alkaline Phosphatase: 117 IU/L (ref 44–121)
BUN/Creatinine Ratio: 12 (ref 9–23)
BUN: 8 mg/dL (ref 6–24)
Bilirubin Total: <0.2 mg/dL (ref 0.0–1.2)
CO2: 24 mmol/L (ref 20–29)
Calcium: 10.1 mg/dL (ref 8.7–10.2)
Chloride: 100 mmol/L (ref 96–106)
Creatinine, Ser: 0.66 mg/dL (ref 0.57–1.00)
GFR calc Af Amer: 119 mL/min/{1.73_m2} (ref >59)
GFR calc non Af Amer: 103 mL/min/{1.73_m2} (ref >59)
Globulin, Total: 2.5 g/dL (ref 1.5–4.5)
Glucose: 82 mg/dL (ref 65–99)
Potassium: 4.6 mmol/L (ref 3.5–5.2)
Sodium: 140 mmol/L (ref 134–144)
Total Protein: 7.2 g/dL (ref 6.0–8.5)

## 2020-01-27 LAB — VITAMIN D 25 HYDROXY (VIT D DEFICIENCY, FRACTURES): Vit D, 25-Hydroxy: 37.4 ng/mL (ref 30.0–100.0)

## 2020-01-27 LAB — HEPATITIS C ANTIBODY: Hep C Virus Ab: <0.1 s/co ratio (ref 0.0–0.9)

## 2020-08-08 ENCOUNTER — Emergency Department (HOSPITAL_BASED_OUTPATIENT_CLINIC_OR_DEPARTMENT_OTHER): Payer: BC Managed Care – PPO

## 2020-08-08 ENCOUNTER — Emergency Department (HOSPITAL_BASED_OUTPATIENT_CLINIC_OR_DEPARTMENT_OTHER)
Admission: EM | Admit: 2020-08-08 | Discharge: 2020-08-08 | Disposition: A | Discharge status: Home / Self Care | Payer: BC Managed Care – PPO | Attending: Emergency Medicine | Admitting: Emergency Medicine

## 2020-08-08 ENCOUNTER — Other Ambulatory Visit: Payer: Self-pay

## 2020-08-08 ENCOUNTER — Encounter (HOSPITAL_BASED_OUTPATIENT_CLINIC_OR_DEPARTMENT_OTHER): Payer: Self-pay | Admitting: Emergency Medicine

## 2020-08-08 DIAGNOSIS — R5383 Other fatigue: Secondary | ICD-10-CM | POA: Insufficient documentation

## 2020-08-08 DIAGNOSIS — R1011 Right upper quadrant pain: Secondary | ICD-10-CM | POA: Insufficient documentation

## 2020-08-08 DIAGNOSIS — K219 Gastro-esophageal reflux disease without esophagitis: Secondary | ICD-10-CM | POA: Diagnosis not present

## 2020-08-08 DIAGNOSIS — R079 Chest pain, unspecified: Secondary | ICD-10-CM | POA: Diagnosis not present

## 2020-08-08 DIAGNOSIS — Z7982 Long term (current) use of aspirin: Secondary | ICD-10-CM | POA: Insufficient documentation

## 2020-08-08 DIAGNOSIS — Z87891 Personal history of nicotine dependence: Secondary | ICD-10-CM | POA: Diagnosis not present

## 2020-08-08 DIAGNOSIS — R1010 Upper abdominal pain, unspecified: Secondary | ICD-10-CM

## 2020-08-08 LAB — CBC
HCT: 40.7 % (ref 36.0–46.0)
Hemoglobin: 13.3 g/dL (ref 12.0–15.0)
MCH: 29.8 pg (ref 26.0–34.0)
MCHC: 32.7 g/dL (ref 30.0–36.0)
MCV: 91.1 fL (ref 80.0–100.0)
Platelets: 319 10*3/uL (ref 150–400)
RBC: 4.47 MIL/uL (ref 3.87–5.11)
RDW: 14.4 % (ref 11.5–15.5)
WBC: 7.5 10*3/uL (ref 4.0–10.5)
nRBC: 0 % (ref 0.0–0.2)

## 2020-08-08 LAB — COMPREHENSIVE METABOLIC PANEL
ALT: 14 U/L (ref 0–44)
AST: 16 U/L (ref 15–41)
Albumin: 4.5 g/dL (ref 3.5–5.0)
Alkaline Phosphatase: 85 U/L (ref 38–126)
Anion gap: 7 (ref 5–15)
BUN: 11 mg/dL (ref 6–20)
CO2: 29 mmol/L (ref 22–32)
Calcium: 10.3 mg/dL (ref 8.9–10.3)
Chloride: 104 mmol/L (ref 98–111)
Creatinine, Ser: 0.75 mg/dL (ref 0.44–1.00)
GFR, Estimated: >60 mL/min (ref >60)
Glucose, Bld: 100 mg/dL — ABNORMAL HIGH (ref 70–99)
Potassium: 3.5 mmol/L (ref 3.5–5.1)
Sodium: 140 mmol/L (ref 135–145)
Total Bilirubin: 0.3 mg/dL (ref 0.3–1.2)
Total Protein: 7.7 g/dL (ref 6.5–8.1)

## 2020-08-08 LAB — TROPONIN I (HIGH SENSITIVITY)
Troponin I (High Sensitivity): 3 ng/L (ref <18)
Troponin I (High Sensitivity): 4 ng/L (ref <18)

## 2020-08-08 LAB — LIPASE, BLOOD: Lipase: 47 U/L (ref 11–51)

## 2020-08-08 MED ORDER — FAMOTIDINE IN NACL 20-0.9 MG/50ML-% IV SOLN
20.0000 mg | Freq: Once | INTRAVENOUS | Status: AC
  Administered 2020-08-08: 20 mg via INTRAVENOUS
  Filled 2020-08-08: qty 50

## 2020-08-08 MED ORDER — MORPHINE SULFATE (PF) 4 MG/ML IV SOLN
4.0000 mg | Freq: Once | INTRAVENOUS | Status: AC
  Administered 2020-08-08: 4 mg via INTRAVENOUS
  Filled 2020-08-08: qty 1

## 2020-08-08 MED ORDER — IOHEXOL 300 MG/ML  SOLN
80.0000 mL | Freq: Once | INTRAMUSCULAR | Status: AC | PRN
  Administered 2020-08-08: 80 mL via INTRAVENOUS

## 2020-08-08 MED ORDER — ASPIRIN 81 MG PO CHEW
324.0000 mg | CHEWABLE_TABLET | Freq: Once | ORAL | Status: AC
  Administered 2020-08-08: 243 mg via ORAL
  Filled 2020-08-08: qty 4

## 2020-08-08 MED ORDER — ONDANSETRON HCL 4 MG/2ML IJ SOLN
4.0000 mg | Freq: Once | INTRAMUSCULAR | Status: AC
  Administered 2020-08-08: 4 mg via INTRAVENOUS
  Filled 2020-08-08: qty 2

## 2020-08-08 NOTE — ED Triage Notes (Signed)
Epigastric pain x 2 days , states " it feels like something is stuck there".

## 2020-08-08 NOTE — Discharge Instructions (Addendum)
You were seen in the emergency department for upper abdominal pain.  Your blood work, EKG, chest x-ray, and a CAT scan of your abdomen pelvis that did not show any obvious explanation for your symptoms.  Start taking your Prilosec every day.

## 2020-08-08 NOTE — ED Provider Notes (Signed)
[facility] EMERGENCY DEPT Provider Note   CSN: [id number] Arrival date & time: 08/08/20  1317     History Chief Complaint  Patient presents with  . Chest Pain    Carrie Jordan is a 50 y.o. female.  She is complaining of 3 days of subxiphoid abdominal pain.  She thought it might be reflux and tried some acid medication without any improvement.  She says sometimes it will radiate up into her left shoulder.  Associated with some fatigue.  No known cardiac history.  No fevers chills nausea vomiting.  No shortness of breath  The history is provided by the patient.  Chest Pain Chest pain location: subxiphoid. Pain quality: aching   Pain radiates to:  L shoulder Pain severity:  Moderate Onset quality:  Gradual Timing:  Intermittent Progression:  Unchanged Chronicity:  New Relieved by:  Nothing Worsened by:  Nothing Ineffective treatments:  Antacids Associated symptoms: abdominal pain and fatigue   Associated symptoms: no back pain, no cough, no diaphoresis, no fever, no headache, no nausea, no shortness of breath and no vomiting        Past Medical History:  Diagnosis Date  . Angio-edema   . GERD (gastroesophageal reflux disease)   . Migraines     Patient Active Problem List   Diagnosis Date Noted  . BMI 30.0-30.9,adult 10/20/2019  . Subclinical hyperthyroidism 05/15/2018  . Allergic rhinitis 07/20/2013  . Constipation, chronic 07/18/2011  . GERD (gastroesophageal reflux disease) 07/18/2011    Past Surgical History:  Procedure Laterality Date  . APPENDECTOMY    . TUBAL LIGATION       OB History   No obstetric history on file.     Family History  Problem Relation Age of Onset  . Thyroid disease Mother   . Allergic rhinitis Sister   . Colon cancer Maternal Grandmother 60  . Cancer Paternal Grandmother        ?blood    Social History   Tobacco Use  . Smoking status: Former Smoker    Packs/day: 1.00    Types: Cigarettes    Quit date:  03/01/2011    Years since quitting: 9.4  . Smokeless tobacco: Former Neurosurgeon    Quit date: 03/20/2011  Vaping Use  . Vaping Use: Never used  Substance Use Topics  . Alcohol use: No  . Drug use: No    Home Medications Prior to Admission medications   Medication Sig Start Date End Date Taking? Authorizing Provider  amLODipine-olmesartan (AZOR) 5-20 MG tablet Take 1 tablet by mouth daily. 05/31/20   [provider]  cyclobenzaprine (FLEXERIL) 5 MG tablet Take 1 tablet (5 mg total) by mouth at bedtime. 01/26/20   Daphine Deutscher Mary-Margaret, FNP  levocetirizine (XYZAL) 5 MG tablet Take 1 tablet (5 mg total) by mouth every evening. Patient not taking: Reported on 01/26/2020 10/20/19   Bennie Pierini, FNP  omeprazole (PRILOSEC) 20 MG capsule Take 1 capsule (20 mg total) by mouth daily. 10/20/19   Daphine Deutscher, Mary-Margaret, FNP  rosuvastatin (CRESTOR) 5 MG tablet Take 5 mg by mouth daily. 05/31/20   [provider]  SUMAtriptan (IMITREX) 100 MG tablet Take 1 tablet (100 mg total) by mouth every 2 (two) hours as needed for migraine. Patient not taking: Reported on 01/26/2020 03/10/18   Johna Sheriff, MD    Allergies    Codeine  Review of Systems   Review of Systems  Constitutional: Positive for fatigue. Negative for diaphoresis and fever.  HENT: Negative for sore throat.  Eyes: Negative for visual disturbance.  Respiratory: Negative for cough and shortness of breath.   Cardiovascular: Positive for chest pain.  Gastrointestinal: Positive for abdominal pain. Negative for nausea and vomiting.  Genitourinary: Negative for dysuria.  Musculoskeletal: Negative for back pain.  Skin: Negative for rash.  Neurological: Negative for headaches.    Physical Exam Updated Vital Signs BP (!) 169/89   Pulse 93   Temp 98.6 F (37 C) (Oral)   Resp 16   LMP 01/07/2017   SpO2 99%   Physical Exam Vitals and nursing note reviewed.  Constitutional:      General: She is not in acute  distress.    Appearance: She is well-developed.  HENT:     Head: Normocephalic and atraumatic.  Eyes:     Conjunctiva/sclera: Conjunctivae normal.  Cardiovascular:     Rate and Rhythm: Normal rate and regular rhythm.     Heart sounds: Normal heart sounds. No murmur heard.   Pulmonary:     Effort: Pulmonary effort is normal. No respiratory distress.     Breath sounds: Normal breath sounds.  Abdominal:     Palpations: Abdomen is soft.     Tenderness: There is abdominal tenderness in the right upper quadrant and epigastric area. There is no guarding or rebound.  Musculoskeletal:        General: Normal range of motion.     Cervical back: Neck supple.     Right lower leg: No tenderness.     Left lower leg: No tenderness.  Skin:    General: Skin is warm and dry.     Capillary Refill: Capillary refill takes less than 2 seconds.  Neurological:     General: No focal deficit present.     Mental Status: She is alert.     ED Results / Procedures / Treatments   Labs (all labs ordered are listed, but only abnormal results are displayed) Labs Reviewed  COMPREHENSIVE METABOLIC PANEL - Abnormal; Notable for the following components:      Result Value   Glucose, Bld 100 (*)    All other components within normal limits  CBC  LIPASE, BLOOD  TROPONIN I (HIGH SENSITIVITY)  TROPONIN I (HIGH SENSITIVITY)    EKG EKG Interpretation  Date/Time:  Monday August 08 2020 13:23:47 EDT Ventricular Rate:  91 PR Interval:  201 QRS Duration: 87 QT Interval:  360 QTC Calculation: 443 R Axis:   60 Text Interpretation: Sinus rhythm Borderline prolonged PR interval Anteroseptal infarct, age indeterminate No old tracing to compare Confirmed by Meridee Score [phone]) on 08/08/2020 1:25:24 PM   Radiology CT Abdomen Pelvis W Contrast  Result Date: 08/08/2020 CLINICAL DATA:  Epigastric pain for the past 2 days. EXAM: CT ABDOMEN AND PELVIS WITH CONTRAST TECHNIQUE: Multidetector CT imaging of the  abdomen and pelvis was performed using the standard protocol following bolus administration of intravenous contrast. CONTRAST:  10mL OMNIPAQUE IOHEXOL 300 MG/ML  SOLN COMPARISON:  CT abdomen pelvis report dated October 08, 2014. FINDINGS: Lower chest: No acute abnormality. Unchanged calcified granulomas in the right lower lobe. Hepatobiliary: Few small calcified granulomas in the liver. No focal liver abnormality. The gallbladder is unremarkable. No biliary dilatation. Pancreas: Unremarkable. No pancreatic ductal dilatation or surrounding inflammatory changes. Spleen: Normal in size.  Multiple punctate calcified granulomas. Adrenals/Urinary Tract: Adrenal glands are unremarkable. Kidneys are normal, without renal calculi, focal lesion, or hydronephrosis. Bladder is unremarkable. Stomach/Bowel: Stomach is within normal limits. Prior appendectomy. No evidence of bowel wall thickening, distention, or  inflammatory changes. Mild sigmoid colonic diverticulosis. Vascular/Lymphatic: Aortic atherosclerosis. No enlarged abdominal or pelvic lymph nodes. Reproductive: Uterus and bilateral adnexa are unremarkable. Prior tubal ligation. Other: No abdominal wall hernia or abnormality. No abdominopelvic ascites. No pneumoperitoneum. Musculoskeletal: No acute or significant osseous findings. IMPRESSION: 1. No acute intra-abdominal process. 2. Aortic Atherosclerosis (ICD10-I70.0). Electronically Signed   By: Obie Dredge M.D.   On: 08/08/2020 15:30   DG Chest Port 1 View  Result Date: 08/08/2020 CLINICAL DATA:  Chest pain, epigastric pain EXAM: PORTABLE CHEST 1 VIEW COMPARISON:  None. FINDINGS: The heart size and mediastinal contours are within normal limits. Both lungs are clear. The visualized skeletal structures are unremarkable. IMPRESSION: No active disease. Electronically Signed   By: Duanne Guess D.O.   On: 08/08/2020 14:13    Procedures Procedures   Medications Ordered in ED Medications  aspirin chewable  tablet 324 mg (has no administration in time range)    ED Course  I have reviewed the triage vital signs and the nursing notes.  Pertinent labs & imaging results that were available during my care of the patient were reviewed by me and considered in my medical decision making (see chart for details).  Clinical Course as of 08/08/20 1713  Mon Aug 08, 2020  1400 Chest x-ray interpreted by me as no acute infiltrates.  Awaiting radiology reading. [MB]    Clinical Course User Index [MB] Terrilee Files, MD   MDM Rules/Calculators/A&P                         This patient complains of upper abdominal pain; this involves an extensive number of treatment Options and is a complaint that carries with it a high risk of complications and Morbidity. The differential includes gastritis, peptic ulcer disease, esophagitis, cholelithiasis, cholecystitis, ACS, pneumonia, pneumothorax  I ordered, reviewed and interpreted labs, which included CBC with normal white count normal hemoglobin, chemistries normal, LFTs normal, troponin low and needs a delta I ordered medication oral aspirin, IV morphine I ordered imaging studies which included chest x-ray and CT abdomen and pelvis and I independently    visualized and interpreted imaging which showed no acute findings Previous records obtained and reviewed in epic, no recent admissions  After the interventions stated above, I reevaluated the patient and found her to be symptomatically improved.  She understands she is pending a second troponin.  Signed out to oncoming provider Dr. Particia Nearing to follow-up with this.  Likely she can be discharged back on her PPI, consider Carafate   Final Clinical Impression(s) / ED Diagnoses Final diagnoses:  Upper abdominal pain    Rx / DC Orders ED Discharge Orders    None       Terrilee Files, MD 08/08/20 1715

## 2020-08-08 NOTE — ED Provider Notes (Signed)
Pt signed out by Dr. Charm Barges awaiting a 2nd troponin.  2nd trop is nl.  Pain is in her epigastrium and she has not been taking her prilosec every day.  She is told to start taking it daily.  Return if worse.  F/u with pcp.   Jacalyn Lefevre, MD 08/08/20 [phone]

## 2021-01-24 ENCOUNTER — Other Ambulatory Visit: Payer: Self-pay | Admitting: Family Medicine

## 2021-01-24 DIAGNOSIS — Z1231 Encounter for screening mammogram for malignant neoplasm of breast: Secondary | ICD-10-CM

## 2021-03-06 ENCOUNTER — Ambulatory Visit: Payer: BC Managed Care – PPO

## 2021-09-05 IMAGING — CT CT ABD-PELV W/ CM
1 of 3 series · 14 of 32 positions shown, 19 images · IV contrast (omnipaque)
Comparison: CT abdomen pelvis report dated October 08, 2014.

CLINICAL DATA: Epigastric pain for the past 2 days.

EXAM:
CT ABDOMEN AND PELVIS WITH CONTRAST
TECHNIQUE: Multidetector CT imaging of the abdomen and pelvis was performed
using the standard protocol following bolus administration of
intravenous contrast.
CONTRAST:  80mL OMNIPAQUE IOHEXOL 300 MG/ML  SOLN

[Series 2: abd pel w · axial · 0.74mm/px · z∈[+799,+1199]mm · 14 of 90 slices shown, 19 images]
[im 5/90  soft-tissue]
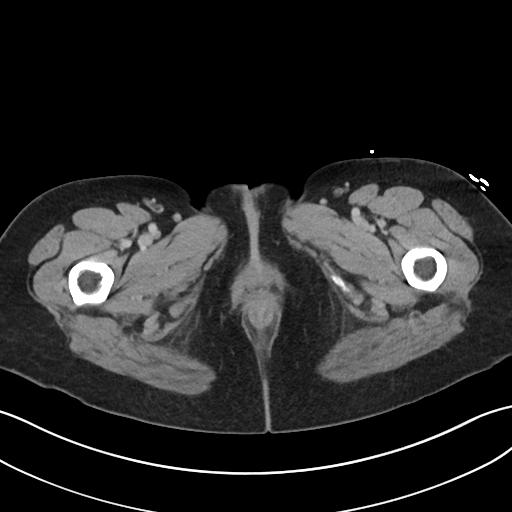
[im 5/90  bone]
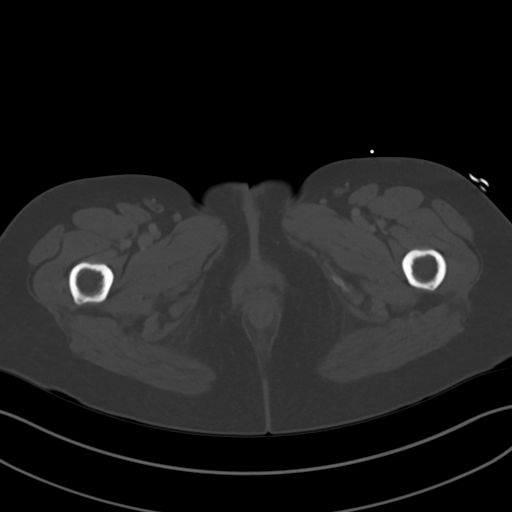
[im 15/90  soft-tissue]
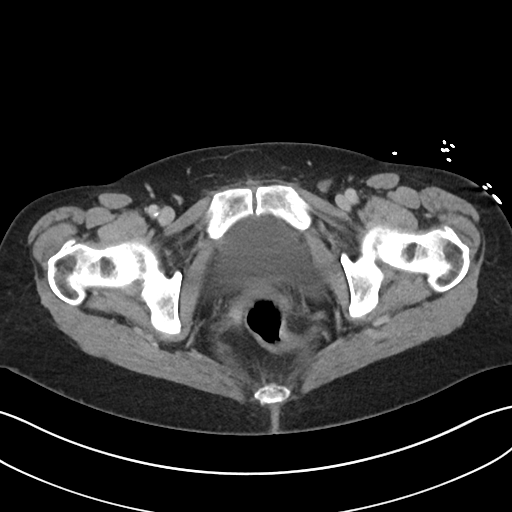
[im 19/90  soft-tissue]
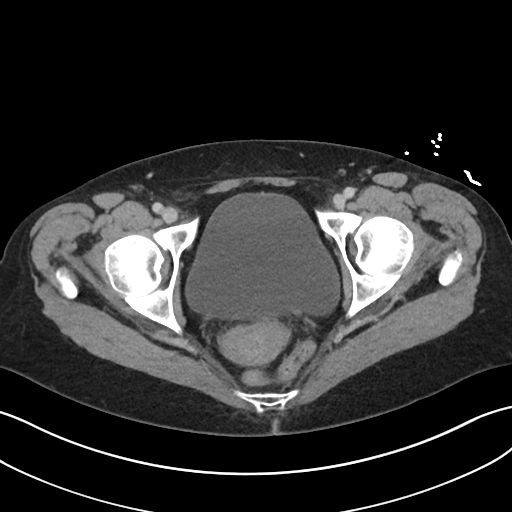
[im 24/90  soft-tissue]
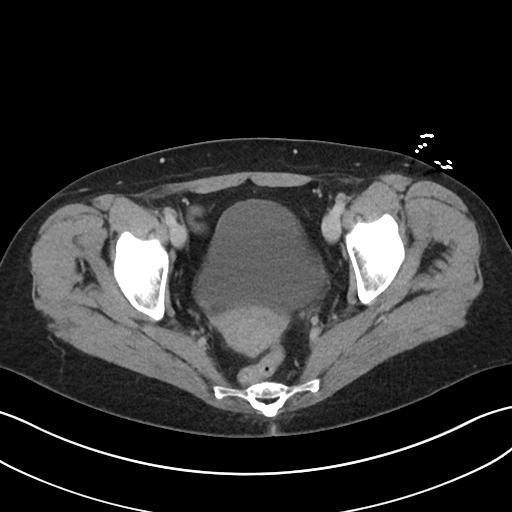
[im 33/90  soft-tissue]
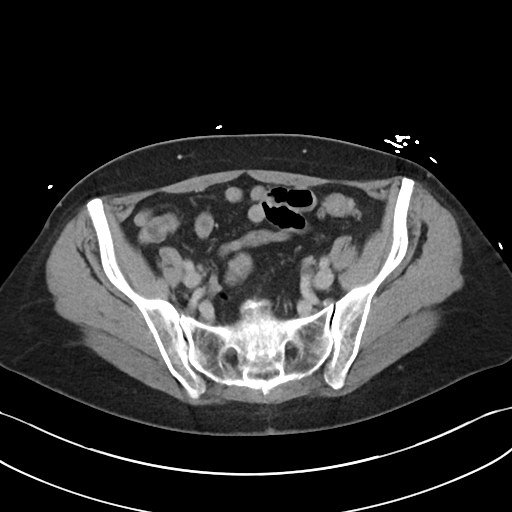
[im 38/90  soft-tissue]
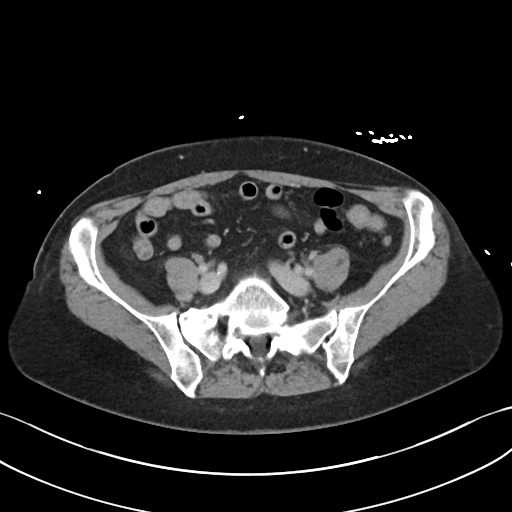
[im 47/90  soft-tissue]
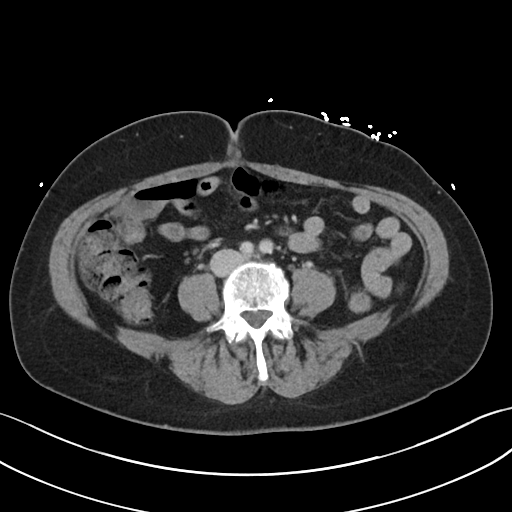
[im 52/90  soft-tissue]
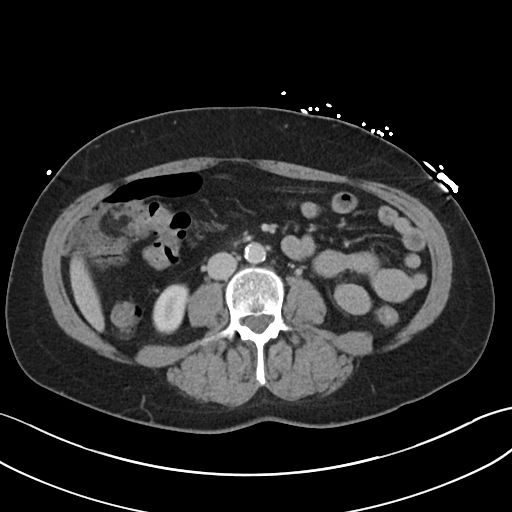
[im 57/90  soft-tissue]
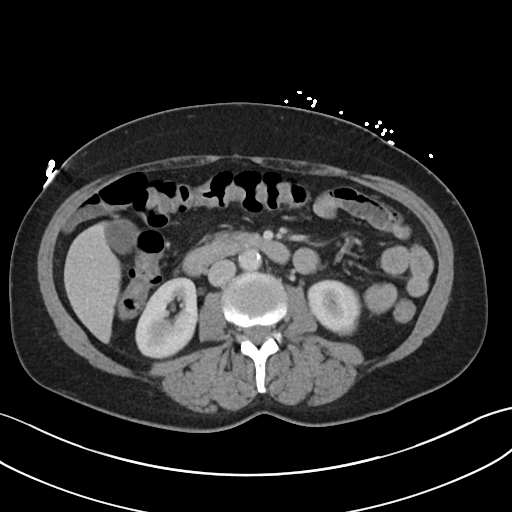
[im 57/90  bone]
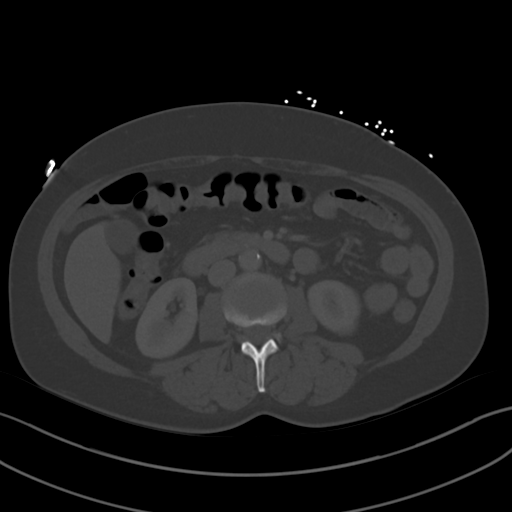
[im 66/90  soft-tissue]
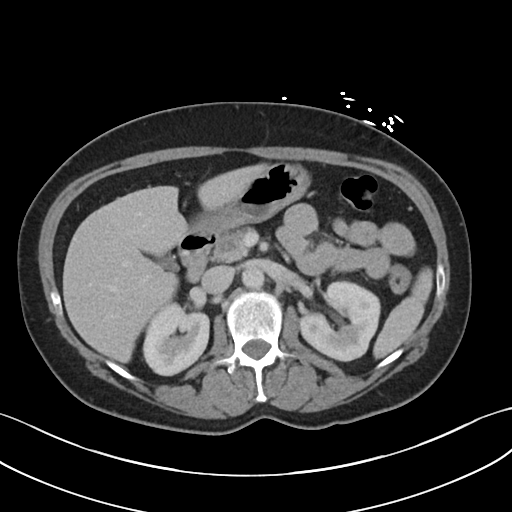
[im 71/90  soft-tissue]
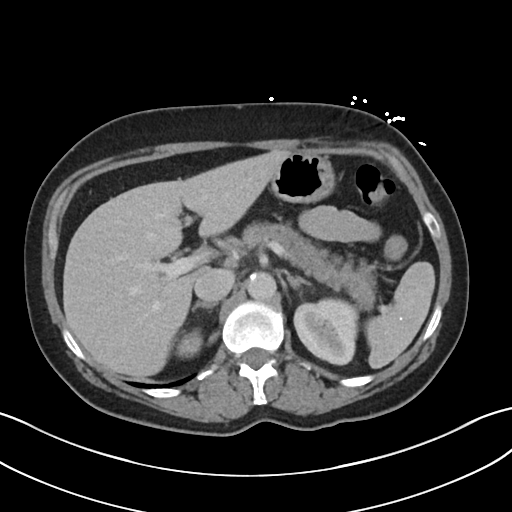
[im 71/90  lung]
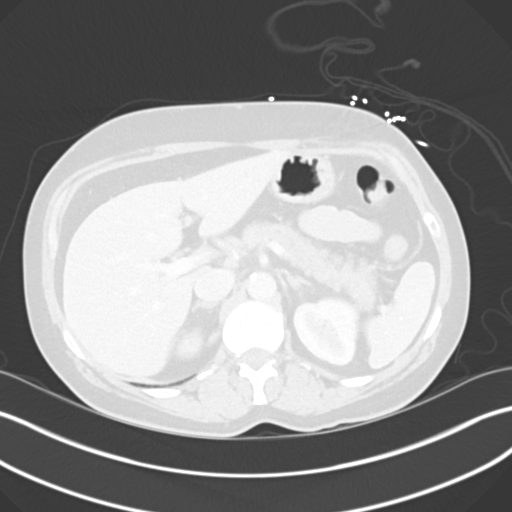
[im 75/90  soft-tissue]
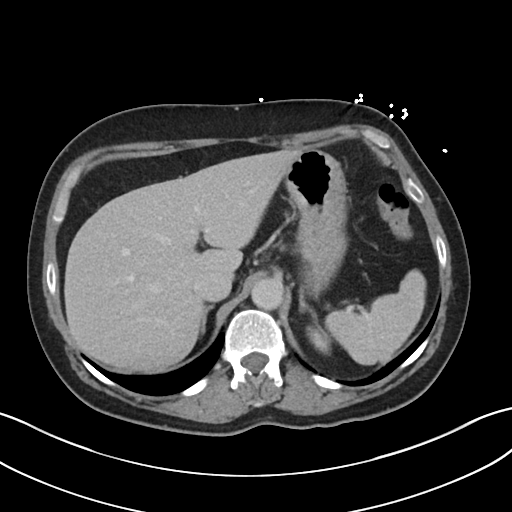
[im 75/90  lung]
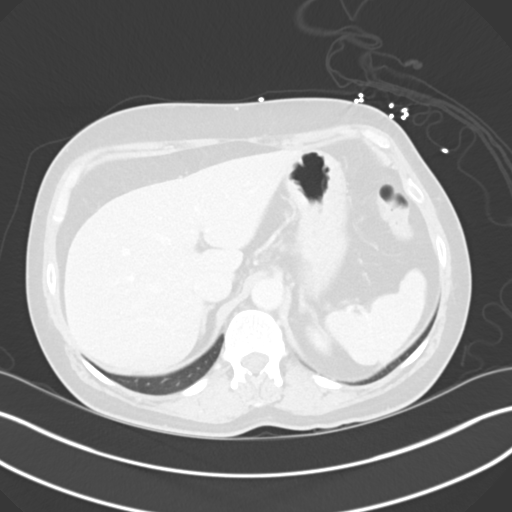
[im 80/90  lung]
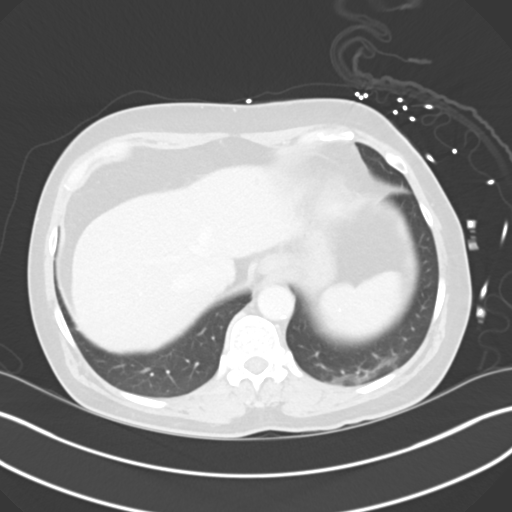
[im 85/90  soft-tissue]
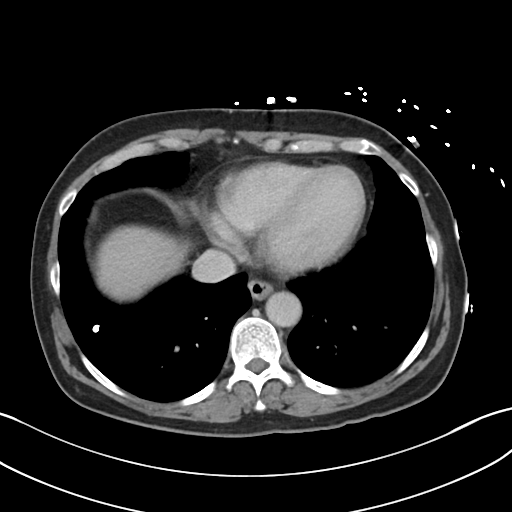
[im 85/90  lung]
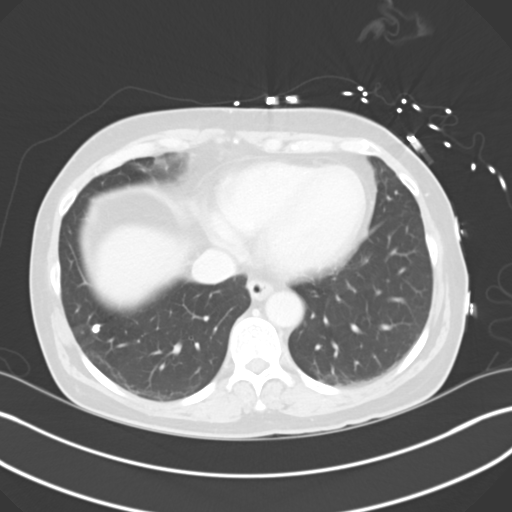

[14 of 32 positions shown; findings below may reference images not displayed]

FINDINGS: Lower chest: No acute abnormality. Unchanged calcified granulomas in
the right lower lobe.

Hepatobiliary: Few small calcified granulomas in the liver. No focal
liver abnormality. The gallbladder is unremarkable. No biliary
dilatation.

Pancreas: Unremarkable. No pancreatic ductal dilatation or
surrounding inflammatory changes.

Spleen: Normal in size.  Multiple punctate calcified granulomas.

Adrenals/Urinary Tract: Adrenal glands are unremarkable. Kidneys are
normal, without renal calculi, focal lesion, or hydronephrosis.
Bladder is unremarkable.

Stomach/Bowel: Stomach is within normal limits. Prior appendectomy.
No evidence of bowel wall thickening, distention, or inflammatory
changes. Mild sigmoid colonic diverticulosis.

Vascular/Lymphatic: Aortic atherosclerosis. No enlarged abdominal or
pelvic lymph nodes.

Reproductive: Uterus and bilateral adnexa are unremarkable. Prior
tubal ligation.

Other: No abdominal wall hernia or abnormality. No abdominopelvic
ascites. No pneumoperitoneum.

Musculoskeletal: No acute or significant osseous findings.
IMPRESSION: 1. No acute intra-abdominal process.
2. Aortic Atherosclerosis ([id number]).

## 2021-09-16 ENCOUNTER — Encounter (HOSPITAL_COMMUNITY)
Admission: EM | Disposition: A | Discharge status: Home / Self Care | Payer: Self-pay | Source: Other Acute Inpatient Hospital | Attending: Cardiovascular Disease

## 2021-09-16 ENCOUNTER — Inpatient Hospital Stay (HOSPITAL_COMMUNITY): Admit: 2021-09-16 | Payer: BC Managed Care – PPO | Admitting: Cardiovascular Disease

## 2021-09-16 ENCOUNTER — Inpatient Hospital Stay (HOSPITAL_COMMUNITY)
Admission: EM | Admit: 2021-09-16 | Discharge: 2021-09-18 | DRG: 287 | Disposition: A | Discharge status: Home / Self Care | Payer: BC Managed Care – PPO | Source: Other Acute Inpatient Hospital | Attending: Cardiovascular Disease | Admitting: Cardiovascular Disease

## 2021-09-16 ENCOUNTER — Encounter (HOSPITAL_COMMUNITY): Payer: Self-pay

## 2021-09-16 DIAGNOSIS — I2102 ST elevation (STEMI) myocardial infarction involving left anterior descending coronary artery: Secondary | ICD-10-CM | POA: Diagnosis not present

## 2021-09-16 DIAGNOSIS — F17210 Nicotine dependence, cigarettes, uncomplicated: Secondary | ICD-10-CM | POA: Diagnosis present

## 2021-09-16 DIAGNOSIS — I440 Atrioventricular block, first degree: Secondary | ICD-10-CM | POA: Diagnosis present

## 2021-09-16 DIAGNOSIS — I5181 Takotsubo syndrome: Secondary | ICD-10-CM | POA: Diagnosis present

## 2021-09-16 DIAGNOSIS — Z8249 Family history of ischemic heart disease and other diseases of the circulatory system: Secondary | ICD-10-CM

## 2021-09-16 DIAGNOSIS — I2510 Atherosclerotic heart disease of native coronary artery without angina pectoris: Secondary | ICD-10-CM | POA: Diagnosis present

## 2021-09-16 DIAGNOSIS — Z800 Family history of malignant neoplasm of digestive organs: Secondary | ICD-10-CM | POA: Diagnosis not present

## 2021-09-16 DIAGNOSIS — R072 Precordial pain: Secondary | ICD-10-CM | POA: Diagnosis present

## 2021-09-16 DIAGNOSIS — E7800 Pure hypercholesterolemia, unspecified: Secondary | ICD-10-CM | POA: Diagnosis present

## 2021-09-16 DIAGNOSIS — K219 Gastro-esophageal reflux disease without esophagitis: Secondary | ICD-10-CM | POA: Diagnosis present

## 2021-09-16 DIAGNOSIS — E785 Hyperlipidemia, unspecified: Principal | ICD-10-CM

## 2021-09-16 DIAGNOSIS — Z885 Allergy status to narcotic agent status: Secondary | ICD-10-CM

## 2021-09-16 DIAGNOSIS — E782 Mixed hyperlipidemia: Secondary | ICD-10-CM | POA: Diagnosis not present

## 2021-09-16 DIAGNOSIS — I213 ST elevation (STEMI) myocardial infarction of unspecified site: Secondary | ICD-10-CM | POA: Diagnosis present

## 2021-09-16 HISTORY — PX: LEFT HEART CATH AND CORONARY ANGIOGRAPHY: CATH118249

## 2021-09-16 HISTORY — PX: CORONARY/GRAFT ACUTE MI REVASCULARIZATION: CATH118305

## 2021-09-16 LAB — CBC WITH DIFFERENTIAL/PLATELET
Abs Immature Granulocytes: 0.03 10*3/uL (ref 0.00–0.07)
Basophils Absolute: 0 10*3/uL (ref 0.0–0.1)
Basophils Relative: 0 %
Eosinophils Absolute: 0.1 10*3/uL (ref 0.0–0.5)
Eosinophils Relative: 1 %
HCT: 36.7 % (ref 36.0–46.0)
Hemoglobin: 12.6 g/dL (ref 12.0–15.0)
Immature Granulocytes: 0 %
Lymphocytes Relative: 36 %
Lymphs Abs: 2.8 10*3/uL (ref 0.7–4.0)
MCH: 31.1 pg (ref 26.0–34.0)
MCHC: 34.3 g/dL (ref 30.0–36.0)
MCV: 90.6 fL (ref 80.0–100.0)
Monocytes Absolute: 0.4 10*3/uL (ref 0.1–1.0)
Monocytes Relative: 5 %
Neutro Abs: 4.3 10*3/uL (ref 1.7–7.7)
Neutrophils Relative %: 58 %
Platelets: 268 10*3/uL (ref 150–400)
RBC: 4.05 MIL/uL (ref 3.87–5.11)
RDW: 13.1 % (ref 11.5–15.5)
WBC: 7.6 10*3/uL (ref 4.0–10.5)
nRBC: 0 % (ref 0.0–0.2)

## 2021-09-16 LAB — PROTIME-INR
INR: 1 (ref 0.8–1.2)
Prothrombin Time: 13.1 seconds (ref 11.4–15.2)

## 2021-09-16 LAB — COMPREHENSIVE METABOLIC PANEL
ALT: 18 U/L (ref 0–44)
AST: 32 U/L (ref 15–41)
Albumin: 3.5 g/dL (ref 3.5–5.0)
Alkaline Phosphatase: 88 U/L (ref 38–126)
Anion gap: 6 (ref 5–15)
BUN: 9 mg/dL (ref 6–20)
CO2: 26 mmol/L (ref 22–32)
Calcium: 9 mg/dL (ref 8.9–10.3)
Chloride: 108 mmol/L (ref 98–111)
Creatinine, Ser: 0.85 mg/dL (ref 0.44–1.00)
GFR, Estimated: >60 mL/min (ref >60)
Glucose, Bld: 121 mg/dL — ABNORMAL HIGH (ref 70–99)
Potassium: 3.9 mmol/L (ref 3.5–5.1)
Sodium: 140 mmol/L (ref 135–145)
Total Bilirubin: 0.6 mg/dL (ref 0.3–1.2)
Total Protein: 6.5 g/dL (ref 6.5–8.1)

## 2021-09-16 LAB — HEMOGLOBIN A1C
Hgb A1c MFr Bld: 5.5 % (ref 4.8–5.6)
Mean Plasma Glucose: 111.15 mg/dL

## 2021-09-16 LAB — HIV ANTIBODY (ROUTINE TESTING W REFLEX): HIV Screen 4th Generation wRfx: NONREACTIVE

## 2021-09-16 LAB — TSH: TSH: 2.178 u[IU]/mL (ref 0.350–4.500)

## 2021-09-16 LAB — POCT ACTIVATED CLOTTING TIME: Activated Clotting Time: 239 seconds

## 2021-09-16 SURGERY — CORONARY/GRAFT ACUTE MI REVASCULARIZATION
Anesthesia: LOCAL

## 2021-09-16 MED ORDER — PANTOPRAZOLE SODIUM 40 MG PO TBEC
40.0000 mg | DELAYED_RELEASE_TABLET | Freq: Every day | ORAL | Status: DC
  Filled 2021-09-16: qty 1

## 2021-09-16 MED ORDER — FENTANYL CITRATE (PF) 100 MCG/2ML IJ SOLN
INTRAMUSCULAR | Status: AC
  Filled 2021-09-16: qty 2

## 2021-09-16 MED ORDER — ONDANSETRON HCL 4 MG/2ML IJ SOLN: 4.0000 mg | Freq: Four times a day (QID) | INTRAMUSCULAR | Status: DC | PRN

## 2021-09-16 MED ORDER — NITROGLYCERIN 1 MG/10 ML FOR IR/CATH LAB
INTRA_ARTERIAL | Status: AC
  Filled 2021-09-16: qty 10

## 2021-09-16 MED ORDER — ASPIRIN 81 MG PO CHEW: 81.0000 mg | CHEWABLE_TABLET | Freq: Every day | ORAL | Status: DC

## 2021-09-16 MED ORDER — IOHEXOL 350 MG/ML SOLN
INTRAVENOUS | Status: DC | PRN
  Administered 2021-09-16: 130 mL via INTRA_ARTERIAL

## 2021-09-16 MED ORDER — FENTANYL CITRATE (PF) 100 MCG/2ML IJ SOLN
INTRAMUSCULAR | Status: DC | PRN
  Administered 2021-09-16: 25 ug via INTRAVENOUS

## 2021-09-16 MED ORDER — ASPIRIN 81 MG PO TBEC
81.0000 mg | DELAYED_RELEASE_TABLET | Freq: Every day | ORAL | Status: DC
  Filled 2021-09-16: qty 1

## 2021-09-16 MED ORDER — ROSUVASTATIN CALCIUM 5 MG PO TABS
5.0000 mg | ORAL_TABLET | Freq: Every evening | ORAL | Status: DC
  Filled 2021-09-16: qty 1

## 2021-09-16 MED ORDER — SODIUM CHLORIDE 0.9% FLUSH
3.0000 mL | Freq: Two times a day (BID) | INTRAVENOUS | Status: DC
  Administered 2021-09-16 – 2021-09-18 (×3): 3 mL via INTRAVENOUS

## 2021-09-16 MED ORDER — NITROGLYCERIN 0.4 MG SL SUBL: 0.4000 mg | SUBLINGUAL_TABLET | SUBLINGUAL | Status: DC | PRN

## 2021-09-16 MED ORDER — LIDOCAINE HCL (PF) 1 % IJ SOLN
INTRAMUSCULAR | Status: AC
  Filled 2021-09-16: qty 30

## 2021-09-16 MED ORDER — SODIUM CHLORIDE 0.9 % IV SOLN
INTRAVENOUS | Status: AC | PRN
  Administered 2021-09-16: 10 mL/h via INTRAVENOUS

## 2021-09-16 MED ORDER — ROSUVASTATIN CALCIUM 5 MG PO TABS: 5.0000 mg | ORAL_TABLET | Freq: Every day | ORAL | Status: DC

## 2021-09-16 MED ORDER — SODIUM CHLORIDE 0.9% FLUSH: 3.0000 mL | INTRAVENOUS | Status: DC | PRN

## 2021-09-16 MED ORDER — ACETAMINOPHEN 325 MG PO TABS
650.0000 mg | ORAL_TABLET | ORAL | Status: DC | PRN
  Administered 2021-09-16 – 2021-09-17 (×2): 650 mg via ORAL
  Filled 2021-09-16 (×2): qty 2

## 2021-09-16 MED ORDER — VERAPAMIL HCL 2.5 MG/ML IV SOLN
INTRAVENOUS | Status: AC
  Filled 2021-09-16: qty 2

## 2021-09-16 MED ORDER — HYDRALAZINE HCL 20 MG/ML IJ SOLN: 10.0000 mg | INTRAMUSCULAR | Status: AC | PRN

## 2021-09-16 MED ORDER — HEPARIN SODIUM (PORCINE) 1000 UNIT/ML IJ SOLN
INTRAMUSCULAR | Status: AC
  Filled 2021-09-16: qty 10

## 2021-09-16 MED ORDER — ATORVASTATIN CALCIUM 40 MG PO TABS
40.0000 mg | ORAL_TABLET | Freq: Every day | ORAL | Status: DC
  Administered 2021-09-16 – 2021-09-17 (×2): 40 mg via ORAL
  Filled 2021-09-16 (×2): qty 1

## 2021-09-16 MED ORDER — PANTOPRAZOLE SODIUM 40 MG PO TBEC
40.0000 mg | DELAYED_RELEASE_TABLET | Freq: Every day | ORAL | Status: DC
Start: 2021-09-16 — End: 2021-09-18
  Administered 2021-09-16 – 2021-09-18 (×3): 40 mg via ORAL
  Filled 2021-09-16 (×3): qty 1

## 2021-09-16 MED ORDER — HEPARIN SODIUM (PORCINE) 5000 UNIT/ML IJ SOLN: 5000.0000 [IU] | Freq: Three times a day (TID) | INTRAMUSCULAR | Status: DC

## 2021-09-16 MED ORDER — VERAPAMIL HCL 2.5 MG/ML IV SOLN
INTRA_ARTERIAL | Status: DC | PRN
  Administered 2021-09-16: 15 mL via INTRA_ARTERIAL

## 2021-09-16 MED ORDER — ACETAMINOPHEN 325 MG PO TABS: 650.0000 mg | ORAL_TABLET | ORAL | Status: DC | PRN

## 2021-09-16 MED ORDER — HEPARIN SODIUM (PORCINE) 1000 UNIT/ML IJ SOLN
INTRAMUSCULAR | Status: DC | PRN
  Administered 2021-09-16: 3000 [IU] via INTRAVENOUS

## 2021-09-16 MED ORDER — LABETALOL HCL 5 MG/ML IV SOLN: 10.0000 mg | INTRAVENOUS | Status: AC | PRN

## 2021-09-16 MED ORDER — SODIUM CHLORIDE 0.9 % IV SOLN: INTRAVENOUS | Status: AC

## 2021-09-16 MED ORDER — SODIUM CHLORIDE 0.9 % IV SOLN: 250.0000 mL | INTRAVENOUS | Status: DC | PRN

## 2021-09-16 MED ORDER — ASPIRIN 81 MG PO TBEC
81.0000 mg | DELAYED_RELEASE_TABLET | Freq: Every day | ORAL | Status: DC
  Administered 2021-09-17 – 2021-09-18 (×2): 81 mg via ORAL
  Filled 2021-09-16 (×2): qty 1

## 2021-09-16 MED ORDER — HEPARIN (PORCINE) IN NACL 1000-0.9 UT/500ML-% IV SOLN
INTRAVENOUS | Status: AC
  Filled 2021-09-16: qty 1000

## 2021-09-16 MED ORDER — HEPARIN (PORCINE) IN NACL 1000-0.9 UT/500ML-% IV SOLN
INTRAVENOUS | Status: DC | PRN
  Administered 2021-09-16 (×2): 500 mL

## 2021-09-16 MED ORDER — HEPARIN (PORCINE) 25000 UT/250ML-% IV SOLN
1350.0000 [IU]/h | INTRAVENOUS | Status: DC
  Administered 2021-09-16: 1000 [IU]/h via INTRAVENOUS
  Filled 2021-09-16 (×2): qty 250

## 2021-09-16 SURGICAL SUPPLY — 15 items
BAND ZEPHYR COMPRESS 30 LONG (HEMOSTASIS) ×1 IMPLANT
CATH 5FR JL3.5 JR4 ANG PIG MP (CATHETERS) ×1 IMPLANT
CATH OPTITORQUE TIG 4.0 5F (CATHETERS) ×1 IMPLANT
CATH VISTA GUIDE 6FR XB3 (CATHETERS) ×1 IMPLANT
GLIDESHEATH SLEND A-KIT 6F 22G (SHEATH) ×1 IMPLANT
GLIDESHEATH SLEND SS 6F .021 (SHEATH) ×1 IMPLANT
GUIDEWIRE INQWIRE 1.5J.035X260 (WIRE) IMPLANT
INQWIRE 1.5J .035X260CM (WIRE) ×2
KIT ENCORE 26 ADVANTAGE (KITS) ×1 IMPLANT
KIT HEART LEFT (KITS) ×2 IMPLANT
PACK CARDIAC CATHETERIZATION (CUSTOM PROCEDURE TRAY) ×2 IMPLANT
SYR MEDRAD MARK 7 150ML (SYRINGE) ×2 IMPLANT
TRANSDUCER W/STOPCOCK (MISCELLANEOUS) ×2 IMPLANT
TUBING CIL FLEX 10 FLL-RA (TUBING) ×2 IMPLANT
WIRE HI TORQ VERSACORE-J 145CM (WIRE) ×1 IMPLANT

## 2021-09-16 NOTE — Progress Notes (Addendum)
ANTICOAGULATION CONSULT NOTE - Initial Consult  Pharmacy Consult for Heparin Indication:  Takotsubo cardiomyopathy   Patient Measurements:   Wt 73 kg Heparin Dosing Weight: 73 kg  Vital Signs: Temp Source: Oral (05/20 1659) BP: 139/87 (05/20 1625) Pulse Rate: 0 (05/20 1647)  Labs: Labs from [facility] wnl   Medical History: GERD, seasonal allergy, obesity, subclinical hyperthyroidism, hyperlipidemia, tobacco use  Medications:  Awaiting home med rec  Assessment: 51 y.o. F presented from [facility] for STEMI. Found to have Takotsubo cardiomyopathy. Plan for heparin to start 4 hours post sheath removal and to continue for 48 hours. Sheath removed at 1630.  Goal of Therapy:  Heparin level 0.3-0.7 units/ml Monitor platelets by anticoagulation protocol: Yes   Plan:  At 2030, start heparin gtt at 1000 units/hr. No bolus. Will f/u heparin level in 8 hours Daily heparin level and CBC Heparin to continue for 48 hr so until 5/22 2030  Christoper Fabian, PharmD, BCPS Please see amion for complete clinical pharmacist phone list 09/16/2021,5:13 PM

## 2021-09-16 NOTE — H&P (Addendum)
Cardiology Admission History and Physical:   Patient ID: Carrie Jordan MRN: [id number]; DOB: July 07, 1970   Admission date: 09/16/2021  PCP:  [facility]   [facility] Providers Cardiologist:  New to Dr Allyson Sabal   Chief Complaint:  Chest pain   Patient Profile:   Carrie Jordan is a 51 year old female with PMH significant of GERD, seasonal allergy, obesity, subclinical hyperthyroidism, hyperlipidemia, tobacco use, who presented to the ER from [facility] for STEMI.   History of Present Illness:   Carrie Jordan is a 51 year old female with above past medical history who presented to the ER from [facility] for STEMI. Patient states she started having midsternal chest pain while mowing the grass. She has been cleaning the house all day at this morning.  Pain started around 12:30 pm.  Pain is pressure quality. Pain is not radiating. She denies any dizziness, shortness of breath, syncope. She denied hx of MI, CVA, DM. She reports having high BP in the past. She smokes cigarettes.  She reports allergy to codeine.  She denied history of recent major bleeding of any kind.   She went to urgent care and then [facility] ER for evaluation. EKG revealed inferolateral STEMI.  WBC 9300, hemoglobin 13.5, platelet 292k.  CMP with sodium 139, potassium 3.7, creatinine 0.85, BUN/creatinine ratio 13, GFR 83, glucose 119, albumin 3.9.  Magnesium 2.  High sensitive troponin 1823.  She was given sublingual nitroglycerin, 243 mg aspirin, 5000 units heparin, 4 mg morphine and 4 mg Zofran.  She was subsequently urgently transferred to [facility] for cardiac cath.     Medications Prior to Admission: Prior to Admission medications   Not on File     Allergies:   Not on File  Social History:   Social History   Socioeconomic History   Marital status: Married    Spouse name: Not on file   Number of children: Not on file   Years of education: Not on file   Highest education level: Not on  file  Occupational History   Not on file  Tobacco Use   Smoking status: Not on file   Smokeless tobacco: Not on file  Substance and Sexual Activity   Alcohol use: Not on file   Drug use: Not on file   Sexual activity: Not on file  Other Topics Concern   Not on file  Social History Narrative   Not on file   Social Determinants of Health   Financial Resource Strain: Not on file  Food Insecurity: Not on file  Transportation Needs: Not on file  Physical Activity: Not on file  Stress: Not on file  Social Connections: Not on file  Intimate Partner Violence: Not on file    Family History:   The patient's family history includes Allergic rhinitis in her sister; Cancer in her paternal grandmother; Colon cancer (age of onset: 51) in her maternal grandmother; Thyroid disease in her mother.    ROS:  Constitutional: Denied fever, chills, malaise, night sweats Eyes: Denied vision change or loss Ears/Nose/Mouth/Throat: Denied ear ache, sore throat, coughing, sinus pain Cardiovascular: see HPI Respiratory: denied shortness of breath Gastrointestinal: Denied nausea, vomiting, abdominal pain, diarrhea Genital/Urinary: Denied dysuria, hematuria, urinary frequency/urgency Musculoskeletal: Denied muscle ache, joint pain, weakness Skin: Denied rash, wound Neuro: Denied headache, dizziness, syncope Psych: Denied history of depression/anxiety  Endocrine: Denied history of diabetes  Physical Exam/Data:   Vitals:   09/16/21 1620 09/16/21 1625 09/16/21 1637 09/16/21 1647  BP: 133/87 139/87  Pulse: (!) 107 89 (!) 0 (!) 0  Resp: (!) 23 14    SpO2: 98% 98%     No intake or output data in the 24 hours ending 09/16/21 1656     View : No data to display.           There is no height or weight on file to calculate BMI.   General Appearance: In no apparent distress, laying in bed HEENT: Normocephalic, atraumatic. Neck: Supple, trachea midline, no JVDs Cardiovascular: Regular rate and  rhythm, normal S1-S2,  no murmur/rub/gallop Respiratory: Resting breathing unlabored, lungs sounds clear to auscultation bilaterally, no use of accessory muscles. On room air.  No wheezes, rales or rhonchi.   Gastrointestinal: Bowel sounds positive, abdomen soft Extremities: Able to move all extremities in bed without difficulty, no edema, radial pulse 2+ bilaterally  Musculoskeletal: Normal muscle bulk and tone Skin: Intact, warm, dry. No rashes or petechiae noted in exposed areas.  Neurologic: Alert, oriented to person, place and time. Fluent speech, no facial droop, no cognitive deficit Psychiatric: Normal affect. Mood is appropriate.      Relevant CV Studies: N/A    Laboratory Data:  High Sensitivity Troponin:  No results for input(s): TROPONINIHS in the last 720 hours.    ChemistryNo results for input(s): NA, K, CL, CO2, GLUCOSE, BUN, CREATININE, CALCIUM, MG, GFRNONAA, GFRAA, ANIONGAP in the last 168 hours.  No results for input(s): PROT, ALBUMIN, AST, ALT, ALKPHOS, BILITOT in the last 168 hours. Lipids No results for input(s): CHOL, TRIG, HDL, LABVLDL, LDLCALC, CHOLHDL in the last 168 hours. HematologyNo results for input(s): WBC, RBC, HGB, HCT, MCV, MCH, MCHC, RDW, PLT in the last 168 hours. Thyroid No results for input(s): TSH, FREET4 in the last 168 hours. BNPNo results for input(s): BNP, PROBNP in the last 168 hours.  DDimer No results for input(s): DDIMER in the last 168 hours.   Radiology/Studies:  CARDIAC CATHETERIZATION  Result Date: 09/16/2021 Images from the original result were not included.   Mid LAD lesion is 50% stenosed.   There is moderate to severe left ventricular systolic dysfunction.   LV end diastolic pressure is moderately elevated.   The left ventricular ejection fraction is 25-35% by visual estimate. Carrie Jordan is a 51 y.o. female  [id number] LOCATION:  FACILITY: [facility] PHYSICIAN: Carrie Jordan, M.D. Nov 25, 1970 DATE OF PROCEDURE:  09/16/2021 DATE OF  DISCHARGE: CARDIAC CATHETERIZATION History obtained from chart review.  51 year old married Caucasian female without prior cardiac history with risk factors include tobacco abuse.  She was mowing her lawn today when she developed chest pain at around 12:30 PM..  She presented to [facility] where her EKG was consistent with an inferior lateral ST segment elevation myocardial infarction.  She was treated with aspirin and IV heparin and was transported to [facility] for urgent cardiac catheterization possible intervention.   [name] has nonobstructive CAD with at most 40 to 50% fairly focal mid LAD stenosis.  Her EF is in the 30% range with apical ballooning consistent with "Takotsubo cardiomyopathy".  The sheath was removed and a TR band was placed on the right wrist to achieve patent hemostasis.  The patient left lab in stable condition.  Heparin will be restarted in 4 hours continue for 48 hours.  She will be treated with guideline directed optimal medical therapy for LV dysfunction. Carrie Jordan. MD, [facility] 09/16/2021 4:49 PM      Assessment and Plan:    STEMI -Transferred from [facility],  taken emergently for heart cath by Dr. Allyson Sabal -High sensitive troponin 1823 at outside facility -Will admit to telemetry unit -Check labs including CBC, CMP, magnesium, TSH, A1c, lipid panel -Will obtain echocardiogram -Post cath order to follow   GERD -on omeprazole at home, start pantoprazole 40mg  daily here    Hyperlipidemia -Resume home medication Crestor 5mg , pending cath findings and lipid panel to adjust dosing    Risk Assessment/Risk Scores:  { TIMI Risk Score for ST  Elevation MI:   The patient's TIMI risk score is 0, which indicates a 0.8% risk of all cause mortality at 30 days.{     Severity of Illness: The appropriate patient status for this patient is INPATIENT. Inpatient status is judged to be reasonable and necessary in order to provide the required intensity of  service to ensure the patient's safety. The patient's presenting symptoms, physical exam findings, and initial radiographic and laboratory data in the context of their chronic comorbidities is felt to place them at high risk for further clinical deterioration. Furthermore, it is not anticipated that the patient will be medically stable for discharge from the hospital within 2 midnights of admission.    * I certify that at the point of admission it is my clinical judgment that the patient will require inpatient hospital care spanning beyond 2 midnights from the point of admission due to high intensity of service, high risk for further deterioration and high frequency of surveillance required.*     For questions or updates, please contact [facility] Please consult [email or web address] for contact info under     Signed, Cyndi Bender, NP  09/16/2021 4:56 PM    Agree with note by Cyndi Bender NP  [name] is a 51 year old married Caucasian female without prior cardiac history.  Her risk factors include tobacco abuse.  She was mowing her lawn this morning and developed substernal chest pain.  She was taken to [facility] where her EKG was consistent with that inferolateral STEMI and she was transferred here by EMS for urgent angiography and potential intervention.  She was hemodynamically stable.  She received aspirin and heparin in the emergency room.  Runell Gess, M.D., FACP, [facility], [facility] [address] [place], [place]  [address]  [phone] 09/16/2021 5:03 PM

## 2021-09-16 NOTE — H&P (Deleted)
Cardiology Admission History and Physical:   Patient ID: Carrie Jordan MRN: [id number]; DOB: [birth year]   Admission date: (Not on file)  PCP:  [facility]   [facility] Providers Cardiologist:  New to Dr Allyson Sabal    Chief Complaint:  Chest pain   Patient Profile:   Carrie Jordan is a 51 year old female with PMH significant of GERD, seasonal allergy, obesity, subclinical hyperthyroidism, hyperlipidemia, tobacco use, who presented to the ER from [facility] for STEMI.   History of Present Illness:   Carrie Jordan is a 51 year old female with above past medical history who presented to the ER from [facility] for STEMI. Patient states she started having midsternal chest pain while mowing the grass. She has been cleaning the house all day at this morning.  Pain started around 12:30 pm.  Pain is pressure quality. Pain is not radiating. She denies any dizziness, shortness of breath, syncope. She denied hx of MI, CVA, DM. She reports having high BP in the past. She smokes cigarettes.  She reports allergy to codeine.  She denied history of recent major bleeding of any kind.  She went to urgent care and then [facility] ER for evaluation. EKG revealed inferolateral STEMI.  WBC 9300, hemoglobin 13.5, platelet 292k.  CMP with sodium 139, potassium 3.7, creatinine 0.85, BUN/creatinine ratio 13, GFR 83, glucose 119, albumin 3.9.  Magnesium 2.  High sensitive troponin 1823.  She was given sublingual nitroglycerin, 243 mg aspirin, 5000 units heparin, 4 mg morphine and 4 mg Zofran.  She was subsequently urgently transferred to [facility] for cardiac cath.    Past Medical History:  Diagnosis Date   Angio-edema    GERD (gastroesophageal reflux disease)    Migraines     Past Surgical History:  Procedure Laterality Date   APPENDECTOMY     TUBAL LIGATION       Medications Prior to Admission: Prior to Admission medications   Medication Sig Start Date End Date Taking? Authorizing  Provider  amLODipine-olmesartan (AZOR) 5-20 MG tablet Take 1 tablet by mouth daily. 05/31/20   [provider]  cyclobenzaprine (FLEXERIL) 5 MG tablet Take 1 tablet (5 mg total) by mouth at bedtime. 01/26/20   Daphine Deutscher Mary-Margaret, FNP  levocetirizine (XYZAL) 5 MG tablet Take 1 tablet (5 mg total) by mouth every evening. Patient not taking: Reported on 01/26/2020 10/20/19   Bennie Pierini, FNP  omeprazole (PRILOSEC) 20 MG capsule Take 1 capsule (20 mg total) by mouth daily. 10/20/19   Daphine Deutscher, Mary-Margaret, FNP  rosuvastatin (CRESTOR) 5 MG tablet Take 5 mg by mouth daily. 05/31/20   [provider]  SUMAtriptan (IMITREX) 100 MG tablet Take 1 tablet (100 mg total) by mouth every 2 (two) hours as needed for migraine. Patient not taking: Reported on 01/26/2020 03/10/18   Johna Sheriff, MD     Allergies:    Allergies  Allergen Reactions   Codeine Nausea And Vomiting    Social History:   Social History   Socioeconomic History   Marital status: Married    Spouse name: Not on file   Number of children: 2   Years of education: Not on file   Highest education level: Not on file  Occupational History   Occupation: cafeteria school  Tobacco Use   Smoking status: Former    Packs/day: 1.00    Types: Cigarettes    Quit date: 03/01/2011    Years since quitting: 10.5   Smokeless tobacco: Former    Quit date: 03/20/2011  Vaping Use   Vaping Use: Never used  Substance and Sexual Activity   Alcohol use: No   Drug use: No   Sexual activity: Not on file  Other Topics Concern   Not on file  Social History Narrative   Lives w/ husband & 2 daughters 5&17   Social Determinants of Health   Financial Resource Strain: Not on file  Food Insecurity: Not on file  Transportation Needs: Not on file  Physical Activity: Not on file  Stress: Not on file  Social Connections: Not on file  Intimate Partner Violence: Not on file    Family History:   The patient's family history  includes Allergic rhinitis in her sister; Cancer in her paternal grandmother; Colon cancer (age of onset: 73) in her maternal grandmother; Thyroid disease in her mother.    ROS:  Constitutional: Denied fever, chills, malaise, night sweats Eyes: Denied vision change or loss Ears/Nose/Mouth/Throat: Denied ear ache, sore throat, coughing, sinus pain Cardiovascular: see HPI Respiratory: denied shortness of breath Gastrointestinal: Denied nausea, vomiting, abdominal pain, diarrhea Genital/Urinary: Denied dysuria, hematuria, urinary frequency/urgency Musculoskeletal: Denied muscle ache, joint pain, weakness Skin: Denied rash, wound Neuro: Denied headache, dizziness, syncope Psych: Denied history of depression/anxiety  Endocrine: Denied history of diabetes    Physical Exam/Data:  There were no vitals filed for this visit. No intake or output data in the 24 hours ending 09/16/21 1559    01/26/2020   12:37 PM 10/20/2019    2:41 PM 06/13/2018    1:54 PM  Last 3 Weights  Weight (lbs) 162 lb 161 lb 192 lb 9.6 oz  Weight (kg) 73.483 kg 73.029 kg 87.363 kg     There is no height or weight on file to calculate BMI.   Vitals: There were no vitals filed for this visit.  General Appearance: In no apparent distress, laying in bed HEENT: Normocephalic, atraumatic. Neck: Supple, trachea midline, no JVDs Cardiovascular: Regular rate and rhythm, normal S1-S2,  no murmur/rub/gallop Respiratory: Resting breathing unlabored, lungs sounds clear to auscultation bilaterally, no use of accessory muscles. On room air.  No wheezes, rales or rhonchi.   Gastrointestinal: Bowel sounds positive, abdomen soft Extremities: Able to move all extremities in bed without difficulty, no edema Musculoskeletal: Normal muscle bulk and tone Skin: Intact, warm, dry. No rashes or petechiae noted in exposed areas.  Neurologic: Alert, oriented to person, place and time. Fluent speech, no facial droop, no cognitive  deficit Psychiatric: Normal affect. Mood is appropriate.     Relevant CV Studies: No previous cardiac work-up available.   Laboratory Data:  High Sensitivity Troponin:  No results for input(s): TROPONINIHS in the last 720 hours.    ChemistryNo results for input(s): NA, K, CL, CO2, GLUCOSE, BUN, CREATININE, CALCIUM, MG, GFRNONAA, GFRAA, ANIONGAP in the last 168 hours.  No results for input(s): PROT, ALBUMIN, AST, ALT, ALKPHOS, BILITOT in the last 168 hours. Lipids No results for input(s): CHOL, TRIG, HDL, LABVLDL, LDLCALC, CHOLHDL in the last 168 hours. HematologyNo results for input(s): WBC, RBC, HGB, HCT, MCV, MCH, MCHC, RDW, PLT in the last 168 hours. Thyroid No results for input(s): TSH, FREET4 in the last 168 hours. BNPNo results for input(s): BNP, PROBNP in the last 168 hours.  DDimer No results for input(s): DDIMER in the last 168 hours.   Radiology/Studies:  No results found.   Assessment and Plan:    STEMI -Transferred from [facility], taken emergently for heart cath by Dr. Allyson Sabal -High sensitive troponin 1823 at outside  facility -Will admit to telemetry unit -Check labs including CBC, CMP, magnesium, TSH, A1c, lipid panel -Will obtain echocardiogram -Post cath order to follow  GERD -on omeprazole at home, start pantoprazole 40mg  daily here   Hyperlipidemia -Resume home medication Crestor 5mg , pending cath findings and lipid panel to adjust dosing    Risk Assessment/Risk Scores:   TIMI Risk Score for ST  Elevation MI:   The patient's TIMI risk score is 0, which indicates a 0.8% risk of all cause mortality at 30 days.{   Severity of Illness: The appropriate patient status for this patient is INPATIENT. Inpatient status is judged to be reasonable and necessary in order to provide the required intensity of service to ensure the patient's safety. The patient's presenting symptoms, physical exam findings, and initial radiographic and laboratory data in the  context of their chronic comorbidities is felt to place them at high risk for further clinical deterioration. Furthermore, it is not anticipated that the patient will be medically stable for discharge from the hospital within 2 midnights of admission.   * I certify that at the point of admission it is my clinical judgment that the patient will require inpatient hospital care spanning beyond 2 midnights from the point of admission due to high intensity of service, high risk for further deterioration and high frequency of surveillance required.*   For questions or updates, please contact [facility] Please consult [email or web address] for contact info under     Signed, , NP  09/16/2021 3:59 PM

## 2021-09-17 ENCOUNTER — Encounter (HOSPITAL_COMMUNITY): Payer: Self-pay | Admitting: Cardiovascular Disease

## 2021-09-17 ENCOUNTER — Other Ambulatory Visit: Payer: Self-pay

## 2021-09-17 ENCOUNTER — Inpatient Hospital Stay (HOSPITAL_COMMUNITY): Payer: BC Managed Care – PPO

## 2021-09-17 DIAGNOSIS — I2102 ST elevation (STEMI) myocardial infarction involving left anterior descending coronary artery: Secondary | ICD-10-CM

## 2021-09-17 DIAGNOSIS — E7800 Pure hypercholesterolemia, unspecified: Secondary | ICD-10-CM

## 2021-09-17 DIAGNOSIS — I2510 Atherosclerotic heart disease of native coronary artery without angina pectoris: Secondary | ICD-10-CM

## 2021-09-17 DIAGNOSIS — I5181 Takotsubo syndrome: Secondary | ICD-10-CM

## 2021-09-17 LAB — ECHOCARDIOGRAM COMPLETE
AR max vel: 2.96 cm2
AV Peak grad: 5.9 mmHg
Ao pk vel: 1.21 m/s
Area-P 1/2: 7.22 cm2
Calc EF: 33.3 %
S' Lateral: 2.6 cm
Single Plane A2C EF: 33.3 %
Single Plane A4C EF: 33.4 %
Weight: 2631.41 oz

## 2021-09-17 LAB — LIPID PANEL
Cholesterol: 215 mg/dL — ABNORMAL HIGH (ref 0–200)
HDL: 43 mg/dL (ref >40)
LDL Cholesterol: 157 mg/dL — ABNORMAL HIGH (ref 0–99)
Total CHOL/HDL Ratio: 5 RATIO
Triglycerides: 74 mg/dL (ref <150)
VLDL: 15 mg/dL (ref 0–40)

## 2021-09-17 LAB — CBC
HCT: 34.5 % — ABNORMAL LOW (ref 36.0–46.0)
Hemoglobin: 11.9 g/dL — ABNORMAL LOW (ref 12.0–15.0)
MCH: 31.4 pg (ref 26.0–34.0)
MCHC: 34.5 g/dL (ref 30.0–36.0)
MCV: 91 fL (ref 80.0–100.0)
Platelets: 236 10*3/uL (ref 150–400)
RBC: 3.79 MIL/uL — ABNORMAL LOW (ref 3.87–5.11)
RDW: 13.2 % (ref 11.5–15.5)
WBC: 8.6 10*3/uL (ref 4.0–10.5)
nRBC: 0 % (ref 0.0–0.2)

## 2021-09-17 LAB — BASIC METABOLIC PANEL
Anion gap: 6 (ref 5–15)
BUN: 9 mg/dL (ref 6–20)
CO2: 24 mmol/L (ref 22–32)
Calcium: 8.7 mg/dL — ABNORMAL LOW (ref 8.9–10.3)
Chloride: 108 mmol/L (ref 98–111)
Creatinine, Ser: 0.73 mg/dL (ref 0.44–1.00)
GFR, Estimated: >60 mL/min (ref >60)
Glucose, Bld: 105 mg/dL — ABNORMAL HIGH (ref 70–99)
Potassium: 3.8 mmol/L (ref 3.5–5.1)
Sodium: 138 mmol/L (ref 135–145)

## 2021-09-17 LAB — GLUCOSE, CAPILLARY: Glucose-Capillary: 105 mg/dL — ABNORMAL HIGH (ref 70–99)

## 2021-09-17 LAB — HEPARIN LEVEL (UNFRACTIONATED)
Heparin Unfractionated: 0.15 IU/mL — ABNORMAL LOW (ref 0.30–0.70)
Heparin Unfractionated: 0.26 IU/mL — ABNORMAL LOW (ref 0.30–0.70)
Heparin Unfractionated: 0.46 IU/mL (ref 0.30–0.70)

## 2021-09-17 MED ORDER — CLOPIDOGREL BISULFATE 75 MG PO TABS
75.0000 mg | ORAL_TABLET | Freq: Every day | ORAL | Status: DC
  Administered 2021-09-17 – 2021-09-18 (×2): 75 mg via ORAL
  Filled 2021-09-17 (×2): qty 1

## 2021-09-17 MED ORDER — PERFLUTREN LIPID MICROSPHERE
1.0000 mL | INTRAVENOUS | Status: AC | PRN
  Administered 2021-09-17: 2 mL via INTRAVENOUS

## 2021-09-17 MED ORDER — CHLORHEXIDINE GLUCONATE CLOTH 2 % EX PADS
6.0000 | MEDICATED_PAD | Freq: Every day | CUTANEOUS | Status: DC
  Administered 2021-09-17: 6 via TOPICAL

## 2021-09-17 NOTE — Progress Notes (Signed)
ANTICOAGULATION CONSULT NOTE - Follow Up Consult  Pharmacy Consult for Heparin Indication: Takotsubo cardiomyopathy  Allergies  Allergen Reactions   Crestor [Rosuvastatin Calcium] Other (See Comments)    Leg cramps     Patient Measurements: Weight: 74.6 kg (164 lb 7.4 oz)  Vital Signs: Temp: 98 F (36.7 C) (05/21 0400) Temp Source: Oral (05/21 0400) BP: 101/66 (05/21 0600) Pulse Rate: 84 (05/21 0600)  Labs: Recent Labs    09/16/21 1741 09/17/21 0553  HGB 12.6  --   HCT 36.7  --   PLT 268  --   LABPROT 13.1  --   INR 1.0  --   HEPARINUNFRC  --  0.15*  CREATININE 0.85  --     CrCl cannot be calculated (Unknown ideal weight.).  Assessment: 51 y.o. female admitted with STEMI s/p cath, found to have Takotsubo cardiomyopathy, for heparin  Goal of Therapy:  Heparin level 0.3-0.7 units/ml Monitor platelets by anticoagulation protocol: Yes   Plan:  Increase Heparin 1200 units/hr Check heparin level in 8 hours.   Eddie Candle 09/17/2021,6:54 AM
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
## 2021-09-17 NOTE — Plan of Care (Signed)
  Problem: Education: Goal: Understanding of CV disease, CV risk reduction, and recovery process will improve Outcome: Progressing Goal: Individualized Educational Video(s) Outcome: Progressing   

## 2021-09-17 NOTE — Progress Notes (Signed)
ANTICOAGULATION CONSULT NOTE - Follow Up Consult  Pharmacy Consult for heparin gtt Indication: Takotsubo cardiomyopathy  Allergies  Allergen Reactions   Crestor [Rosuvastatin Calcium] Other (See Comments)    Leg cramps     Patient Measurements: Weight: 74.6 kg (164 lb 7.4 oz)  Vital Signs: Temp: 98.6 F (37 C) (05/21 1151) Temp Source: Oral (05/21 1151) BP: 111/65 (05/21 1231) Pulse Rate: 87 (05/21 1231)  Labs: Recent Labs    09/16/21 1741 09/17/21 0553 09/17/21 1336  HGB 12.6 11.9*  --   HCT 36.7 34.5*  --   PLT 268 236  --   LABPROT 13.1  --   --   INR 1.0  --   --   HEPARINUNFRC  --  0.15* 0.26*  CREATININE 0.85 0.73  --     CrCl cannot be calculated (Unknown ideal weight.).   Assessment: 51 y.o. F presented from [facility] for STEMI. Found to have Takotsubo cardiomyopathy. Plan for heparin to start 4 hours post sheath removal and to continue for 48 hours. Heparin started last night at 2030.  Most recent heparin level is subtherapeutic at 0.26 on heparin 1200 units/hr. No issues noted per RN. CBC stable.  Goal of Therapy:  Heparin level 0.3-0.7 units/ml Monitor platelets by anticoagulation protocol: Yes   Plan:  Heparin 1350 units/hr  6h heparin level Daily heparin level and CBC Monitor for s/sx of bleeding Heparin to continue for 48 hr so until 5/22 2030  Thank you for including pharmacy in the care of this patient.  Lissa Merlin, PharmD PGY1 Acute Care Pharmacy Resident  Phone: [phone] 09/17/2021  3:01 PM  Please check [email or web address] for unit-specific pharmacy phone numbers.

## 2021-09-17 NOTE — Progress Notes (Signed)
ANTICOAGULATION CONSULT NOTE - Follow Up Consult  Pharmacy Consult for heparin gtt Indication: Takotsubo cardiomyopathy  Allergies  Allergen Reactions   Crestor [Rosuvastatin Calcium] Other (See Comments)    Leg cramps     Patient Measurements: Weight: 74.6 kg (164 lb 7.4 oz)  Vital Signs: Temp: 98.7 F (37.1 C) (05/21 1947) Temp Source: Oral (05/21 1947) BP: 132/78 (05/21 1947) Pulse Rate: 79 (05/21 1947)  Labs: Recent Labs    09/16/21 1741 09/17/21 0553 09/17/21 1336 09/17/21 2122  HGB 12.6 11.9*  --   --   HCT 36.7 34.5*  --   --   PLT 268 236  --   --   LABPROT 13.1  --   --   --   INR 1.0  --   --   --   HEPARINUNFRC  --  0.15* 0.26* 0.46  CREATININE 0.85 0.73  --   --      CrCl cannot be calculated (Unknown ideal weight.).   Assessment: 51 y.o. F presented from [facility] for STEMI. Found to have Takotsubo cardiomyopathy. Plan for heparin to start 4 hours post sheath removal and to continue for 48 hours. Heparin started last night at 2030.  Heparin level therapeutic (0.46) on gtt at 1350 units/hr. No bleeding noted.  Goal of Therapy:  Heparin level 0.3-0.7 units/ml Monitor platelets by anticoagulation protocol: Yes   Plan:  Heparin 1350 units/hr  Daily heparin level and CBC Heparin to continue for 48 hr so until 5/22 2030  Thank you for including pharmacy in the care of this patient.  Sherlon Handing, PharmD, BCPS Please see [email or web address] for complete clinical pharmacist phone list 09/17/2021  10:30 PM

## 2021-09-18 ENCOUNTER — Encounter (HOSPITAL_BASED_OUTPATIENT_CLINIC_OR_DEPARTMENT_OTHER): Payer: Self-pay | Admitting: Emergency Medicine

## 2021-09-18 ENCOUNTER — Other Ambulatory Visit (HOSPITAL_COMMUNITY): Payer: Self-pay

## 2021-09-18 ENCOUNTER — Encounter (HOSPITAL_COMMUNITY): Payer: Self-pay | Admitting: Cardiovascular Disease

## 2021-09-18 DIAGNOSIS — K219 Gastro-esophageal reflux disease without esophagitis: Secondary | ICD-10-CM

## 2021-09-18 DIAGNOSIS — E782 Mixed hyperlipidemia: Secondary | ICD-10-CM

## 2021-09-18 LAB — CBC
HCT: 33.8 % — ABNORMAL LOW (ref 36.0–46.0)
Hemoglobin: 11.2 g/dL — ABNORMAL LOW (ref 12.0–15.0)
MCH: 30.6 pg (ref 26.0–34.0)
MCHC: 33.1 g/dL (ref 30.0–36.0)
MCV: 92.3 fL (ref 80.0–100.0)
Platelets: 167 10*3/uL (ref 150–400)
RBC: 3.66 MIL/uL — ABNORMAL LOW (ref 3.87–5.11)
RDW: 13.1 % (ref 11.5–15.5)
WBC: 8.3 10*3/uL (ref 4.0–10.5)
nRBC: 0 % (ref 0.0–0.2)

## 2021-09-18 LAB — HEPARIN LEVEL (UNFRACTIONATED): Heparin Unfractionated: 0.5 IU/mL (ref 0.30–0.70)

## 2021-09-18 MED ORDER — NICOTINE 21 MG/24HR TD PT24
21.0000 mg | MEDICATED_PATCH | TRANSDERMAL | 1 refills | Status: AC
  Filled 2021-09-18 – 2021-11-23 (×2): qty 28, 28d supply, fill #0

## 2021-09-18 MED ORDER — ATORVASTATIN CALCIUM 40 MG PO TABS
40.0000 mg | ORAL_TABLET | Freq: Every day | ORAL | 11 refills | Status: DC
  Filled 2021-09-18: qty 90, 90d supply, fill #0

## 2021-09-18 MED ORDER — NITROGLYCERIN 0.4 MG SL SUBL
0.4000 mg | SUBLINGUAL_TABLET | SUBLINGUAL | 1 refills | Status: AC | PRN
  Filled 2021-09-18: qty 25, 14d supply, fill #0

## 2021-09-18 MED ORDER — PANTOPRAZOLE SODIUM 40 MG PO TBEC
40.0000 mg | DELAYED_RELEASE_TABLET | Freq: Every day | ORAL | 3 refills | Status: DC
Start: 2021-09-19 — End: 2022-01-17
  Filled 2021-09-18 – 2021-12-12 (×2): qty 90, 90d supply, fill #0

## 2021-09-18 MED ORDER — CARVEDILOL 3.125 MG PO TABS: 3.1250 mg | ORAL_TABLET | Freq: Two times a day (BID) | ORAL | Status: DC

## 2021-09-18 MED ORDER — ASPIRIN 81 MG PO TBEC
81.0000 mg | DELAYED_RELEASE_TABLET | Freq: Every day | ORAL | 3 refills | Status: DC
  Filled 2021-09-18 – 2021-12-12 (×3): qty 90, 90d supply, fill #0
  Filled 2022-03-22: qty 90, 90d supply, fill #1

## 2021-09-18 MED ORDER — CLOPIDOGREL BISULFATE 75 MG PO TABS
75.0000 mg | ORAL_TABLET | Freq: Every day | ORAL | 3 refills | Status: DC
  Filled 2021-09-18: qty 90, 90d supply, fill #0

## 2021-09-18 MED ORDER — CARVEDILOL 3.125 MG PO TABS
3.1250 mg | ORAL_TABLET | Freq: Two times a day (BID) | ORAL | 11 refills | Status: DC
Start: 2021-09-18 — End: 2022-01-17
  Filled 2021-09-18: qty 180, 90d supply, fill #0

## 2021-09-18 NOTE — Progress Notes (Signed)
Patient discharged home. IV and telemetry removed. Discharge instructions explained and given to patient.
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
## 2021-09-18 NOTE — TOC Transition Note (Signed)
Transition of Care [facility]) - CM/SW Discharge Note   Patient Details  Name: Carrie Jordan MRN: [id number] Date of Birth: 11/01/1970  Transition of Care [facility]) CM/SW Contact:  Zenon Mayo, RN Phone Number: 09/18/2021, 10:11 AM   Clinical Narrative:    Patient is for dc, has no needs. TOC to fill meds.         Patient Goals and CMS Choice        Discharge Placement                       Discharge Plan and Services                                     Social Determinants of Health (SDOH) Interventions     Readmission Risk Interventions     View : No data to display.

## 2021-09-18 NOTE — Plan of Care (Signed)
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
## 2021-09-18 NOTE — TOC Benefit Eligibility Note (Signed)
Patient Product/process development scientist completed.    The patient is currently admitted and upon discharge could be taking Entresto 24-26 mg.  Requires Prior Authorization   The patient is currently admitted and upon discharge could be taking Jardiance 10 mg.  The current 30 day co-pay is, $47.00.   The patient is currently admitted and upon discharge could be taking Farxiga 10 mg.  The current 30 day co-pay is, $47.00.   The patient is insured through [employer]     Roland Earl, CPhT Pharmacy Patient Advocate Specialist [facility] Patient Advocate Team Direct Number: [phone]  Fax: [phone]

## 2021-09-19 LAB — LIPOPROTEIN A (LPA): Lipoprotein (a): 22.7 nmol/L (ref <75.0)

## 2021-09-28 ENCOUNTER — Other Ambulatory Visit (HOSPITAL_COMMUNITY): Payer: Self-pay

## 2021-09-29 ENCOUNTER — Ambulatory Visit (HOSPITAL_BASED_OUTPATIENT_CLINIC_OR_DEPARTMENT_OTHER): Payer: BC Managed Care – PPO | Admitting: Family

## 2021-09-29 ENCOUNTER — Telehealth (HOSPITAL_COMMUNITY): Payer: Self-pay | Admitting: *Deleted

## 2021-09-29 ENCOUNTER — Encounter (HOSPITAL_COMMUNITY): Payer: Self-pay

## 2021-09-29 ENCOUNTER — Encounter (HOSPITAL_BASED_OUTPATIENT_CLINIC_OR_DEPARTMENT_OTHER): Payer: Self-pay | Admitting: Family

## 2021-09-29 ENCOUNTER — Ambulatory Visit (HOSPITAL_COMMUNITY)
Admit: 2021-09-29 | Discharge: 2021-09-29 | Disposition: A | Discharge status: Home / Self Care | Payer: BC Managed Care – PPO | Attending: Cardiology | Admitting: Cardiology

## 2021-09-29 VITALS — BP 132/72 | HR 75 | Ht 67.0 in | Wt 168.1 lb

## 2021-09-29 VITALS — BP 128/76 | HR 76 | Wt 167.0 lb

## 2021-09-29 DIAGNOSIS — I5022 Chronic systolic (congestive) heart failure: Secondary | ICD-10-CM | POA: Insufficient documentation

## 2021-09-29 DIAGNOSIS — Y990 Civilian activity done for income or pay: Secondary | ICD-10-CM | POA: Diagnosis not present

## 2021-09-29 DIAGNOSIS — E785 Hyperlipidemia, unspecified: Secondary | ICD-10-CM | POA: Diagnosis not present

## 2021-09-29 DIAGNOSIS — I252 Old myocardial infarction: Secondary | ICD-10-CM | POA: Diagnosis not present

## 2021-09-29 DIAGNOSIS — F17210 Nicotine dependence, cigarettes, uncomplicated: Secondary | ICD-10-CM | POA: Insufficient documentation

## 2021-09-29 DIAGNOSIS — I428 Other cardiomyopathies: Secondary | ICD-10-CM | POA: Insufficient documentation

## 2021-09-29 DIAGNOSIS — Y92219 Unspecified school as the place of occurrence of the external cause: Secondary | ICD-10-CM | POA: Diagnosis not present

## 2021-09-29 DIAGNOSIS — Z8249 Family history of ischemic heart disease and other diseases of the circulatory system: Secondary | ICD-10-CM | POA: Insufficient documentation

## 2021-09-29 DIAGNOSIS — Z7982 Long term (current) use of aspirin: Secondary | ICD-10-CM | POA: Insufficient documentation

## 2021-09-29 DIAGNOSIS — I5181 Takotsubo syndrome: Secondary | ICD-10-CM | POA: Diagnosis not present

## 2021-09-29 DIAGNOSIS — Z7902 Long term (current) use of antithrombotics/antiplatelets: Secondary | ICD-10-CM | POA: Insufficient documentation

## 2021-09-29 DIAGNOSIS — Z79899 Other long term (current) drug therapy: Secondary | ICD-10-CM | POA: Diagnosis not present

## 2021-09-29 DIAGNOSIS — I2510 Atherosclerotic heart disease of native coronary artery without angina pectoris: Secondary | ICD-10-CM | POA: Diagnosis not present

## 2021-09-29 DIAGNOSIS — I25118 Atherosclerotic heart disease of native coronary artery with other forms of angina pectoris: Secondary | ICD-10-CM

## 2021-09-29 LAB — BASIC METABOLIC PANEL
Anion gap: 6 (ref 5–15)
BUN: 8 mg/dL (ref 6–20)
CO2: 28 mmol/L (ref 22–32)
Calcium: 9.4 mg/dL (ref 8.9–10.3)
Chloride: 107 mmol/L (ref 98–111)
Creatinine, Ser: 0.69 mg/dL (ref 0.44–1.00)
GFR, Estimated: >60 mL/min (ref >60)
Glucose, Bld: 81 mg/dL (ref 70–99)
Potassium: 4 mmol/L (ref 3.5–5.1)
Sodium: 141 mmol/L (ref 135–145)

## 2021-09-29 LAB — BRAIN NATRIURETIC PEPTIDE: B Natriuretic Peptide: 350.8 pg/mL — ABNORMAL HIGH (ref 0.0–100.0)

## 2021-09-29 MED ORDER — DAPAGLIFLOZIN PROPANEDIOL 10 MG PO TABS: 10.0000 mg | ORAL_TABLET | Freq: Every day | ORAL | 11 refills | Status: DC

## 2021-09-29 NOTE — Patient Instructions (Addendum)
Medication Instructions:  Continue your current medications.   *If you need a refill on your cardiac medications before your next appointment, please call your pharmacy*  Lab Work: None ordered today.   Testing/Procedures: None ordered today.   Follow-Up: At [facility], you and your health needs are our priority.  As part of our continuing mission to provide you with exceptional heart care, we have created designated Provider Care Teams.  These Care Teams include your primary Cardiologist (physician) and Advanced Practice Providers (APPs -  Physician Assistants and Nurse Practitioners) who all work together to provide you with the care you need, when you need it.  We recommend signing up for the patient portal called "MyChart".  Sign up information is provided on this After Visit Summary.  MyChart is used to connect with patients for Virtual Visits (Telemedicine).  Patients are able to view lab/test results, encounter notes, upcoming appointments, etc.  Non-urgent messages can be sent to your provider as well.   To learn more about what you can do with MyChart, go to [email or web address].    Your next appointment:   2 month(s)  The format for your next appointment:   In Person  Provider:   You will see one of the following Advanced Practice Providers on your designated Care Team:   [place], PA-C in [place] or [place]   Other Instructions  Check your blood pressure once per day and keep a log. If you will bring the log to your next clinic visit. Call us or send a MyChart message if your blood pressure is consistently more than 130/80.  Heart Healthy Diet Recommendations: A low-salt diet is recommended. Meats should be grilled, baked, or boiled. Avoid fried foods. Focus on lean protein sources like fish or chicken with vegetables and fruits. The American Heart Association is a Chief Technology Officer!  American Heart Association Diet and Lifeystyle Recommendations   Exercise  recommendations: The American Heart Association recommends 150 minutes of moderate intensity exercise weekly. Try 30 minutes of moderate intensity exercise 4-5 times per week. This could include walking, jogging, or swimming.

## 2021-09-29 NOTE — Patient Instructions (Signed)
START Farxiga 10 mg, one tab daily  Labs today We will only contact you if something comes back abnormal or we need to make some changes. Otherwise no news is good news!  Your physician recommends that you schedule a follow-up appointment in: 4 weeks with the [facility] team   Do the following things EVERYDAY: Weigh yourself in the morning before breakfast. Write it down and keep it in a log. Take your medicines as prescribed Eat low salt foods--Limit salt (sodium) to 2000 mg per day.  Stay as active as you can everyday Limit all fluids for the day to less than 2 liters  At the [facility], you and your health needs are our priority. As part of our continuing mission to provide you with exceptional heart care, we have created designated Provider Care Teams. These Care Teams include your primary Cardiologist (physician) and Advanced Practice Providers (APPs- Physician Assistants and Nurse Practitioners) who all work together to provide you with the care you need, when you need it.   You may see any of the following providers on your designated Care Team at your next follow up: Dr Arvilla Meres Dr Carron Curie, NP Robbie Lis, [place] [facility] [place], [place] Karle Plumber, PharmD   Please be sure to bring in all your medications bottles to every appointment.

## 2021-09-29 NOTE — Telephone Encounter (Signed)
Call attempted to confirm HV TOC appt 11 am on 09/29/21. HIPPA appropriate VM left with callback number.    Rhae Hammock, BSN, Scientist, clinical (histocompatibility and immunogenetics) Only
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
## 2021-10-19 ENCOUNTER — Encounter: Payer: Self-pay | Admitting: Internal Medicine

## 2021-10-27 ENCOUNTER — Ambulatory Visit (HOSPITAL_COMMUNITY)
Admission: RE | Admit: 2021-10-27 | Discharge: 2021-10-27 | Disposition: A | Discharge status: Home / Self Care | Payer: BC Managed Care – PPO | Source: Ambulatory Visit | Attending: Physician Assistant | Admitting: Physician Assistant

## 2021-10-27 ENCOUNTER — Telehealth (HOSPITAL_COMMUNITY): Payer: Self-pay | Admitting: *Deleted

## 2021-10-27 ENCOUNTER — Encounter (HOSPITAL_COMMUNITY): Payer: Self-pay

## 2021-10-27 VITALS — BP 134/80 | HR 57 | Wt 168.0 lb

## 2021-10-27 DIAGNOSIS — I5022 Chronic systolic (congestive) heart failure: Secondary | ICD-10-CM | POA: Diagnosis not present

## 2021-10-27 DIAGNOSIS — Z7982 Long term (current) use of aspirin: Secondary | ICD-10-CM | POA: Diagnosis not present

## 2021-10-27 DIAGNOSIS — I428 Other cardiomyopathies: Secondary | ICD-10-CM | POA: Diagnosis not present

## 2021-10-27 DIAGNOSIS — I2510 Atherosclerotic heart disease of native coronary artery without angina pectoris: Secondary | ICD-10-CM

## 2021-10-27 DIAGNOSIS — Z888 Allergy status to other drugs, medicaments and biological substances status: Secondary | ICD-10-CM | POA: Insufficient documentation

## 2021-10-27 DIAGNOSIS — I5020 Unspecified systolic (congestive) heart failure: Secondary | ICD-10-CM | POA: Diagnosis present

## 2021-10-27 DIAGNOSIS — E782 Mixed hyperlipidemia: Secondary | ICD-10-CM

## 2021-10-27 DIAGNOSIS — Z79899 Other long term (current) drug therapy: Secondary | ICD-10-CM | POA: Insufficient documentation

## 2021-10-27 DIAGNOSIS — F17210 Nicotine dependence, cigarettes, uncomplicated: Secondary | ICD-10-CM | POA: Diagnosis not present

## 2021-10-27 DIAGNOSIS — Z8249 Family history of ischemic heart disease and other diseases of the circulatory system: Secondary | ICD-10-CM | POA: Diagnosis not present

## 2021-10-27 DIAGNOSIS — Z885 Allergy status to narcotic agent status: Secondary | ICD-10-CM | POA: Insufficient documentation

## 2021-10-27 DIAGNOSIS — K219 Gastro-esophageal reflux disease without esophagitis: Secondary | ICD-10-CM | POA: Diagnosis not present

## 2021-10-27 DIAGNOSIS — Z720 Tobacco use: Secondary | ICD-10-CM

## 2021-10-27 DIAGNOSIS — E785 Hyperlipidemia, unspecified: Secondary | ICD-10-CM | POA: Insufficient documentation

## 2021-10-27 DIAGNOSIS — Z7902 Long term (current) use of antithrombotics/antiplatelets: Secondary | ICD-10-CM | POA: Diagnosis not present

## 2021-10-27 MED ORDER — ENTRESTO 24-26 MG PO TABS: 1.0000 | ORAL_TABLET | Freq: Two times a day (BID) | ORAL | 11 refills | Status: DC

## 2021-10-27 NOTE — Telephone Encounter (Signed)
Call attempted to confirm HV TOC appt 12:30 pm on 10/27/21. HIPPA appropriate VM left with callback number.    Rhae Hammock, BSN, Scientist, clinical (histocompatibility and immunogenetics) Only
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
## 2021-10-27 NOTE — Patient Instructions (Addendum)
EKG done today.  No Labs done today.  START Entresto 24-26mg  (1 tablet) by mouth 2 times daily.   No other medication changes were made. Please continue all current medications as prescribed.  Please complete blood work in 2 weeks. A paper prescription for blood work was provided to you during your appointment today.   Your physician recommends that you schedule a follow-up appointment as needed.   If you have any questions or concerns before your next appointment please send [place] a message through [place] or call our office at [phone].    TO LEAVE A MESSAGE FOR THE NURSE SELECT OPTION 2, PLEASE LEAVE A MESSAGE INCLUDING: YOUR NAME DATE OF BIRTH CALL BACK NUMBER REASON FOR CALL**this is important as we prioritize the call backs  YOU WILL RECEIVE A CALL BACK THE SAME DAY AS LONG AS YOU CALL BEFORE 4:00 PM   Do the following things EVERYDAY: Weigh yourself in the morning before breakfast. Write it down and keep it in a log. Take your medicines as prescribed Eat low salt foods--Limit salt (sodium) to 2000 mg per day.  Stay as active as you can everyday Limit all fluids for the day to less than 2 liters

## 2021-11-01 ENCOUNTER — Other Ambulatory Visit (HOSPITAL_COMMUNITY): Payer: Self-pay

## 2021-11-23 ENCOUNTER — Other Ambulatory Visit (HOSPITAL_COMMUNITY): Payer: Self-pay | Admitting: Adult Health

## 2021-11-24 ENCOUNTER — Other Ambulatory Visit (HOSPITAL_COMMUNITY): Payer: Self-pay

## 2021-12-12 ENCOUNTER — Other Ambulatory Visit (HOSPITAL_COMMUNITY): Payer: Self-pay

## 2021-12-14 ENCOUNTER — Other Ambulatory Visit (HOSPITAL_COMMUNITY): Payer: Self-pay

## 2022-01-17 ENCOUNTER — Ambulatory Visit: Payer: BC Managed Care – PPO | Attending: Student | Admitting: Student

## 2022-01-17 ENCOUNTER — Encounter: Payer: Self-pay | Admitting: Student

## 2022-01-17 VITALS — BP 132/78 | HR 71 | Ht 68.0 in | Wt 167.6 lb

## 2022-01-17 DIAGNOSIS — I2510 Atherosclerotic heart disease of native coronary artery without angina pectoris: Secondary | ICD-10-CM | POA: Diagnosis not present

## 2022-01-17 DIAGNOSIS — I5181 Takotsubo syndrome: Secondary | ICD-10-CM | POA: Diagnosis not present

## 2022-01-17 DIAGNOSIS — I5020 Unspecified systolic (congestive) heart failure: Secondary | ICD-10-CM

## 2022-01-17 DIAGNOSIS — E785 Hyperlipidemia, unspecified: Secondary | ICD-10-CM

## 2022-01-17 MED ORDER — CARVEDILOL 3.125 MG PO TABS: 3.1250 mg | ORAL_TABLET | Freq: Two times a day (BID) | ORAL | 3 refills | Status: DC

## 2022-01-17 MED ORDER — PANTOPRAZOLE SODIUM 40 MG PO TBEC: 40.0000 mg | DELAYED_RELEASE_TABLET | Freq: Every day | ORAL | 3 refills | Status: DC

## 2022-01-17 MED ORDER — ATORVASTATIN CALCIUM 40 MG PO TABS: 40.0000 mg | ORAL_TABLET | Freq: Every day | ORAL | 3 refills | Status: DC

## 2022-01-17 MED ORDER — ENTRESTO 24-26 MG PO TABS: 1.0000 | ORAL_TABLET | Freq: Two times a day (BID) | ORAL | 3 refills | Status: DC

## 2022-01-17 MED ORDER — DAPAGLIFLOZIN PROPANEDIOL 10 MG PO TABS: 10.0000 mg | ORAL_TABLET | Freq: Every day | ORAL | 3 refills | Status: DC

## 2022-01-17 NOTE — Patient Instructions (Addendum)
Medication Instructions:   Restart Entresto 24-26 mg Two Times Daily  Stop Taking Plavix  *If you need a refill on your cardiac medications before your next appointment, please call your pharmacy*   Lab Work: NONE   If you have labs (blood work) drawn today and your tests are completely normal, you will receive your results only by: [place] (if you have MyChart) OR A paper copy in the mail If you have any lab test that is abnormal or we need to change your treatment, we will call you to review the results.   Testing/Procedures: Your physician has requested that you have an echocardiogram. Echocardiography is a painless test that uses sound waves to create images of your heart. It provides your doctor with information about the size and shape of your heart and how well your heart's chambers and valves are working. This procedure takes approximately one hour. There are no restrictions for this procedure.    Follow-Up: At [facility], you and your health needs are our priority.  As part of our continuing mission to provide you with exceptional heart care, we have created designated Provider Care Teams.  These Care Teams include your primary Cardiologist (physician) and Advanced Practice Providers (APPs -  Physician Assistants and Nurse Practitioners) who all work together to provide you with the care you need, when you need it.  We recommend signing up for the patient portal called "MyChart".  Sign up information is provided on this After Visit Summary.  MyChart is used to connect with patients for Virtual Visits (Telemedicine).  Patients are able to view lab/test results, encounter notes, upcoming appointments, etc.  Non-urgent messages can be sent to your provider as well.   To learn more about what you can do with MyChart, go to [email or web address].    Your next appointment:   2 month(s)  The format for your next appointment:   In Person  Provider:   Bernerd Pho, PA-C    Other Instructions Thank you for choosing [facility]!    Important Information About [place]

## 2022-01-17 NOTE — Progress Notes (Signed)
Cardiology Office Note    Date:  01/17/2022   ID:  Carrie Jordan, DOB 23-Jun-1970, MRN [id number]  PCP:  [facility]  Cardiologist: Nanetta Batty, MD --> Wants to follow-up in [facility] Complaint  Patient presents with   Follow-up    2 month visit    History of Present Illness:    Carrie Jordan is a 51 y.o. female with past medical history of HFrEF/Takotsubo cardiomyopathy (EF at 30-35% by echo in 08/2021 and nonobstructive CAD by cath), CAD (cath in 08/2021 showing 50% mid-LAD stenosis), HLD and tobacco use who presents to the office today for 94-month follow-up.   She was last examined by Anna Genre, PA in the [facility] in 09/2021 and reported overall doing well at that time and denied any recent chest pain or dyspnea on exertion. She had quit smoking. She was continued on Coreg 3.125 mg twice daily, Farxiga 10 mg daily and Entresto 24-26 mg twice daily with plans to obtain a follow-up echocardiogram once medical therapy had been optimized. She was also on DAPT with ASA and Plavix as it was previously recommended to continue DAPT for 3 months from her event.  In talking with the patient today, she reports still being under increased stress at her job and is considering other options. 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 She reports her breathing has been stable and denies any recent exertional chest pain or palpitations. No specific orthopnea, PND or pitting edema.  She did stop taking Plavix last month as she had no additional refills. She has not yet started [facility] as she was informed by her pharmacy this was not covered by her insurance.   Past Medical History:  Diagnosis Date   Angio-edema    GERD (gastroesophageal reflux disease)    Migraines    Takotsubo cardiomyopathy    a. EF at 30-35% by echo in 08/2021 and nonobstructive CAD by cath    Past Surgical History:  Procedure Laterality Date    APPENDECTOMY     CORONARY/GRAFT ACUTE MI REVASCULARIZATION N/A 09/16/2021   Procedure: Coronary/Graft Acute MI Revascularization;  Surgeon: Runell Gess, MD;  Location: [facility] INVASIVE CV LAB;  Service: Cardiovascular;  Laterality: N/A;   LEFT HEART CATH AND CORONARY ANGIOGRAPHY N/A 09/16/2021   Procedure: LEFT HEART CATH AND CORONARY ANGIOGRAPHY;  Surgeon: Runell Gess, MD;  Location: [facility] INVASIVE CV LAB;  Service: Cardiovascular;  Laterality: N/A;   TUBAL LIGATION      Current Medications: Outpatient Medications Prior to Visit  Medication Sig Dispense Refill   aspirin EC 81 MG tablet Take 1 tablet (81 mg total) by mouth daily. Swallow whole. 90 tablet 3   cyclobenzaprine (FLEXERIL) 5 MG tablet Take 5 mg by mouth at bedtime.     nicotine (NICODERM CQ - DOSED IN MG/24 HOURS) 21 mg/24hr patch Place 1 patch (21 mg total) onto the skin daily. 28 patch 1   nitroGLYCERIN (NITROSTAT) 0.4 MG SL tablet Place 1 tablet (0.4 mg total) under the tongue every 5 (five) minutes x 3 doses as needed for chest pain. 25 tablet 1   SUMAtriptan (IMITREX) 100 MG tablet Take 1 tablet (100 mg total) by mouth every 2 (two) hours as needed for migraine. 10 tablet 3   atorvastatin (LIPITOR) 40 MG tablet Take 1 tablet (40 mg total) by mouth daily at 6 PM. 30 tablet 11   carvedilol (COREG) 3.125 MG tablet Take 1 tablet (3.125  mg total) by mouth 2 (two) times daily with a meal. 60 tablet 11   clopidogrel (PLAVIX) 75 MG tablet Take 1 tablet (75 mg total) by mouth daily. 90 tablet 3   dapagliflozin propanediol (FARXIGA) 10 MG TABS tablet Take 1 tablet (10 mg total) by mouth daily before breakfast. 30 tablet 11   pantoprazole (PROTONIX) 40 MG tablet Take 1 tablet by mouth daily. 90 tablet 3   sacubitril-valsartan (ENTRESTO) 24-26 MG Take 1 tablet by mouth 2 (two) times daily. (Patient not taking: Reported on 01/17/2022) 60 tablet 11   No facility-administered medications prior to visit.     Allergies:   Crestor  [rosuvastatin calcium] and Codeine   Social History   Socioeconomic History   Marital status: Married    Spouse name: Not on file   Number of children: 2   Years of education: Not on file   Highest education level: Not on file  Occupational History   Occupation: cafeteria school  Tobacco Use   Smoking status: Every Day    Packs/day: 0.80    Types: Cigarettes   Smokeless tobacco: Never   Tobacco comments:    Smoking cessation  Vaping Use   Vaping Use: Never used  Substance and Sexual Activity   Alcohol use: Not Currently    Comment: socially   Drug use: Never   Sexual activity: Not on file  Other Topics Concern   Not on file  Social History Narrative   ** Merged History Encounter **       Lives w/ husband & 2 daughters 5&17   Social Determinants of Health   Financial Resource Strain: Low Risk  (09/18/2021)   Overall Financial Resource Strain (CARDIA)    Difficulty of Paying Living Expenses: Not very hard  Food Insecurity: No Food Insecurity (09/18/2021)   Hunger Vital Sign    Worried About Running Out of Food in the Last Year: Never true    Ran Out of Food in the Last Year: Never true  Transportation Needs: No Transportation Needs (09/18/2021)   PRAPARE - Hydrologist (Medical): No    Lack of Transportation (Non-Medical): No  Physical Activity: Not on file  Stress: Not on file  Social Connections: Not on file     Family History:  The patient's family history includes Allergic rhinitis in her sister; Cancer in her paternal grandmother; Colon cancer (age of onset: 61) in her maternal grandmother; Thyroid disease in her mother.   Review of Systems:    Please see the history of present illness.     All other systems reviewed and are otherwise negative except as noted above.   Physical Exam:    VS:  BP 132/78   Pulse 71   Ht 5\' 8"  (1.727 m)   Wt 167 lb 9.6 oz (76 kg)   LMP 01/07/2017   SpO2 97%   BMI 25.48 kg/m    General: Well  developed, well nourished,female appearing in no acute distress. Head: Normocephalic, atraumatic. Neck: No carotid bruits. JVD not elevated.  Lungs: Respirations regular and unlabored, without wheezes or rales.  Heart: Regular rate and rhythm. No S3 or S4.  No murmur, no rubs, or gallops appreciated. Abdomen: Appears non-distended. No obvious abdominal masses. Msk:  Strength and tone appear normal for age. No obvious joint deformities or effusions. Extremities: No clubbing or cyanosis. No pitting edema.  Distal pedal pulses are 2+ bilaterally. Neuro: Alert and oriented X 3. Moves all extremities spontaneously.  No focal deficits noted. Psych:  Responds to questions appropriately with a normal affect. Skin: No rashes or lesions noted  Wt Readings from Last 3 Encounters:  01/17/22 167 lb 9.6 oz (76 kg)  10/27/21 168 lb (76.2 kg)  09/29/21 167 lb (75.8 kg)     Studies/Labs Reviewed:   EKG:  EKG is not ordered today.   Recent Labs: 09/16/2021: ALT 18; TSH 2.178 09/18/2021: Hemoglobin 11.2; Platelets 167 09/29/2021: B Natriuretic Peptide 350.8; BUN 8; Creatinine, Ser 0.69; Potassium 4.0; Sodium 141   Lipid Panel    Component Value Date/Time   CHOL 215 (H) 09/17/2021 0553   CHOL 293 (H) 01/26/2020 1312   CHOL 218 07/18/2011 1245   TRIG 74 09/17/2021 0553   TRIG 112 07/18/2011 1245   HDL 43 09/17/2021 0553   HDL 51 01/26/2020 1312   CHOLHDL 5.0 09/17/2021 0553   VLDL 15 09/17/2021 0553   LDLCALC 157 (H) 09/17/2021 0553   LDLCALC 218 (H) 01/26/2020 1312    Additional studies/ records that were reviewed today include:   Cardiac Catheterization: 08/2021 IMPRESSION: [name] has nonobstructive CAD with at most 40 to 50% fairly focal mid LAD stenosis.  Her EF is in the 30% range with apical ballooning consistent with "Takotsubo cardiomyopathy".  The sheath was removed and a TR band was placed on the right wrist to achieve patent hemostasis.  The patient left lab in stable condition.   Heparin will be restarted in 4 hours continue for 48 hours.  She will be treated with guideline directed optimal medical therapy for LV dysfunction.   Echocardiogram: 08/2021 MPRESSIONS     1. No left ventricular thrombus (Definity used). Apical ballooning is  seen, suggestive of takotsubo syndrome. Left ventricular ejection  fraction, by estimation, is 30 to 35%. The left ventricle has moderately  decreased function. The left ventricle  demonstrates regional wall motion abnormalities (see scoring  diagram/findings for description). Left ventricular diastolic parameters  were normal. There is severe hypokinesis of the left ventricular, entire  apical segment.   2. Right ventricular systolic function is normal. The right ventricular  size is normal. There is normal pulmonary artery systolic pressure.   3. The mitral valve is normal in structure. Mild mitral valve  regurgitation. No evidence of mitral stenosis.   4. The aortic valve is tricuspid. Aortic valve regurgitation is not  visualized. No aortic stenosis is present.   5. The inferior vena cava is normal in size with greater than 50%  respiratory variability, suggesting right atrial pressure of 3 mmHg.    Assessment:    1. HFrEF (heart failure with reduced ejection fraction) (HCC)   2. Takotsubo cardiomyopathy   3. Coronary artery disease involving native coronary artery of native heart without angina pectoris   4. Hyperlipidemia LDL goal <70      Plan:   In order of problems listed above:  1. HFrEF - Her EF was at 30-35% by echo in 08/2021 and she had nonobstructive CAD by catheterization. Overall, her presentation was felt to be most consistent with Takotsubo Cardiomyopathy.  - She appears euvolemic on examination today and her respiratory status has been stable. Will continue Coreg 3.125 mg twice daily and Farxiga 10 mg daily. She was unable to start [facility] after her last visit as she was informed this was not covered  by her insurance. She does have BCBS and we will provide a 30-day card and co-pay card today. I encouraged her to make [place] aware if  this remains unaffordable. She is scheduled for repeat labs next week. Will plan to obtain an echocardiogram in 2 months for reassessment of her EF and arrange for follow-up afterwards.   2. CAD - Catheterization in 08/2021 showed 50% mid-LAD stenosis. She does experience occasional jaw pain when she feels anxious but denies any exertional chest pain or progressive dyspnea on exertion. Continue ASA 81 mg daily, Coreg 3.125 mg twice daily and Atorvastatin 40 mg daily. She already stopped Plavix and will not restart at this time as it was recommended for her to have a 85-month course of DAPT.   3. HLD - Her LDL was at 157 in 08/2021 and she was started on Atorvastatin 40mg  daily. She is scheduled for follow-up labs with her PCP next week and we will request a copy of these.   Medication Adjustments/Labs and Tests Ordered: Current medicines are reviewed at length with the patient today.  Concerns regarding medicines are outlined above.  Medication changes, Labs and Tests ordered today are listed in the Patient Instructions below. Patient Instructions  Medication Instructions:   Restart Entresto 24-26 mg Two Times Daily  Stop Taking Plavix  *If you need a refill on your cardiac medications before your next appointment, please call your pharmacy*   Lab Work: NONE   If you have labs (blood work) drawn today and your tests are completely normal, you will receive your results only by: MyChart Message (if you have MyChart) OR A paper copy in the mail If you have any lab test that is abnormal or we need to change your treatment, we will call you to review the results.   Testing/Procedures: Your physician has requested that you have an echocardiogram. Echocardiography is a painless test that uses sound waves to create images of your heart. It provides your doctor with  information about the size and shape of your heart and how well your heart's chambers and valves are working. This procedure takes approximately one hour. There are no restrictions for this procedure.    Follow-Up: At [facility], you and your health needs are our priority.  As part of our continuing mission to provide you with exceptional heart care, we have created designated Provider Care Teams.  These Care Teams include your primary Cardiologist (physician) and Advanced Practice Providers (APPs -  Physician Assistants and Nurse Practitioners) who all work together to provide you with the care you need, when you need it.  We recommend signing up for the patient portal called "MyChart".  Sign up information is provided on this After Visit Summary.  MyChart is used to connect with patients for Virtual Visits (Telemedicine).  Patients are able to view lab/test results, encounter notes, upcoming appointments, etc.  Non-urgent messages can be sent to your provider as well.   To learn more about what you can do with MyChart, go to [facility].    Your next appointment:   2 month(s)  The format for your next appointment:   In Person  Provider:   [email or web address], PA-C    Other Instructions Thank you for choosing [facility]!    Important Information About Sugar         Signed, Randall An, PA-C  01/17/2022 4:49 PM    [facility] [address] [place], [place] Phone: [phone] Fax: [phone]

## 2022-02-21 ENCOUNTER — Ambulatory Visit (HOSPITAL_COMMUNITY)
Admission: RE | Admit: 2022-02-21 | Discharge: 2022-02-21 | Disposition: A | Discharge status: Home / Self Care | Payer: BC Managed Care – PPO | Source: Ambulatory Visit | Attending: Student | Admitting: Student

## 2022-02-21 DIAGNOSIS — I5181 Takotsubo syndrome: Secondary | ICD-10-CM | POA: Insufficient documentation

## 2022-02-21 LAB — ECHOCARDIOGRAM LIMITED
Calc EF: 54 %
S' Lateral: 2.9 cm
Single Plane A2C EF: 55.1 %
Single Plane A4C EF: 55.8 %

## 2022-02-21 NOTE — Progress Notes (Signed)
*  PRELIMINARY RESULTS* Echocardiogram Limited 2-D Echocardiogram  has been performed.  Carrie Jordan 02/21/2022, 3:33 PM

## 2022-03-01 ENCOUNTER — Encounter: Payer: Self-pay | Admitting: Internal Medicine

## 2022-03-01 ENCOUNTER — Ambulatory Visit: Payer: BC Managed Care – PPO | Attending: Internal Medicine | Admitting: Internal Medicine

## 2022-03-01 VITALS — BP 116/78 | HR 73 | Ht 68.0 in | Wt 166.4 lb

## 2022-03-01 DIAGNOSIS — Z720 Tobacco use: Secondary | ICD-10-CM | POA: Insufficient documentation

## 2022-03-01 DIAGNOSIS — E785 Hyperlipidemia, unspecified: Secondary | ICD-10-CM | POA: Insufficient documentation

## 2022-03-01 DIAGNOSIS — I5181 Takotsubo syndrome: Secondary | ICD-10-CM

## 2022-03-01 DIAGNOSIS — I2510 Atherosclerotic heart disease of native coronary artery without angina pectoris: Secondary | ICD-10-CM

## 2022-03-01 DIAGNOSIS — E7849 Other hyperlipidemia: Secondary | ICD-10-CM

## 2022-03-01 MED ORDER — LOSARTAN POTASSIUM 25 MG PO TABS: 25.0000 mg | ORAL_TABLET | Freq: Every day | ORAL | 1 refills | Status: DC

## 2022-03-01 NOTE — Patient Instructions (Addendum)
Medication Instructions:  Your physician has recommended you make the following change in your medication:  Stop entresto Start losartan 25 mg daily Continue other medications the same  Labwork: none  Testing/Procedures: none  Follow-Up: Your physician recommends that you schedule a follow-up appointment in: 6 months  Any Other Special Instructions Will Be Listed Below (If Applicable).  If you need a refill on your cardiac medications before your next appointment, please call your pharmacy.

## 2022-03-01 NOTE — Progress Notes (Signed)
Cardiology Office Note  Date: 03/01/2022   ID: Ardyth Harps, DOB 01-Jun-1970, MRN [id number]  PCP:  [facility]  Cardiologist:  Chalmers Guest, MD Electrophysiologist:  None   Reason for Office Visit: Follow-up of Takotsubo cardiomyopathy   History of Present Illness: Carrie Jordan is a 51 y.o. female known to have HF improved EF (Takotsubo cardiomyopathy in May 2023 with a EF 30 to 35% improved to 55 to 60% in September 2023), moderate CAD (mid LAD 50% stenosis), HLD and tobacco abuse who presents to the cardiology clinic today for follow-up of Takotsubo cardiomyopathy.  No interval ER visits or hospitalizations. Patient denied any rest or exertional chest discomfort, tightness, heaviness or pressure, rest or exertional dyspnea, palpitations, light-headedness, syncope and LE swelling. Compliant with medications and no side-effects.  Patient received Entresto 30-day sample and not able to fill her prescription after due to insurance denial. No bleeding complications. Continues to smoke cigarettes, denied alcohol use or illicit drug abuse.   Past Medical History:  Diagnosis Date   Angio-edema    GERD (gastroesophageal reflux disease)    Migraines    Takotsubo cardiomyopathy    a. EF at 30-35% by echo in 08/2021 and nonobstructive CAD by cath    Past Surgical History:  Procedure Laterality Date   APPENDECTOMY     CORONARY/GRAFT ACUTE MI REVASCULARIZATION N/A 09/16/2021   Procedure: Coronary/Graft Acute MI Revascularization;  Surgeon: Lorretta Harp, MD;  Location: [place] CV [facility];  Service: Cardiovascular;  Laterality: N/A;   LEFT HEART CATH AND CORONARY ANGIOGRAPHY N/A 09/16/2021   Procedure: LEFT HEART CATH AND CORONARY ANGIOGRAPHY;  Surgeon: Lorretta Harp, MD;  Location: [facility] CV [facility];  Service: Cardiovascular;  Laterality: N/A;   TUBAL LIGATION      Current Outpatient Medications  Medication Sig Dispense Refill   aspirin EC 81 MG  tablet Take 1 tablet (81 mg total) by mouth daily. Swallow whole. 90 tablet 3   atorvastatin (LIPITOR) 40 MG tablet Take 1 tablet (40 mg total) by mouth daily at 6 PM. 90 tablet 3   carvedilol (COREG) 3.125 MG tablet Take 1 tablet (3.125 mg total) by mouth 2 (two) times daily with a meal. 180 tablet 3   dapagliflozin propanediol (FARXIGA) 10 MG TABS tablet Take 1 tablet (10 mg total) by mouth daily before breakfast. 90 tablet 3   nicotine (NICODERM CQ - DOSED IN MG/24 HOURS) 21 mg/24hr patch Place 1 patch (21 mg total) onto the skin daily. 28 patch 1   nitroGLYCERIN (NITROSTAT) 0.4 MG SL tablet Place 1 tablet (0.4 mg total) under the tongue every 5 (five) minutes x 3 doses as needed for chest pain. 25 tablet 1   pantoprazole (PROTONIX) 40 MG tablet Take 1 tablet by mouth daily. 90 tablet 3   sacubitril-valsartan (ENTRESTO) 24-26 MG Take 1 tablet by mouth 2 (two) times daily. 180 tablet 3   No current facility-administered medications for this visit.   Allergies:  Crestor [rosuvastatin calcium] and Codeine   Social History: The patient  reports that she has been smoking cigarettes. She has been smoking an average of .8 packs per day. She has never used smokeless tobacco. She reports that she does not currently use alcohol. She reports that she does not use drugs.   Family History: The patient's family history includes Allergic rhinitis in her sister; Cancer in her paternal grandmother; Colon cancer (age of onset: 6) in her maternal grandmother; Thyroid disease in her  mother.   ROS:  Please see the history of present illness. Otherwise, complete review of systems is positive for none.  All other systems are reviewed and negative.   Physical Exam: VS:  BP 116/78   Pulse 73   Ht 5\' 8"  (1.727 m)   Wt 166 lb 6.4 oz (75.5 kg)   LMP 01/07/2017   SpO2 97%   BMI 25.30 kg/m , BMI Body mass index is 25.3 kg/m.  Wt Readings from Last 3 Encounters:  03/01/22 166 lb 6.4 oz (75.5 kg)  01/17/22 167  lb 9.6 oz (76 kg)  10/27/21 168 lb (76.2 kg)    General: Patient appears comfortable at rest. HEENT: Conjunctiva and lids normal, oropharynx clear with moist mucosa. Neck: Supple, no elevated JVP or carotid bruits, no thyromegaly. Lungs: Clear to auscultation, nonlabored breathing at rest. Cardiac: Regular rate and rhythm, no S3 or significant systolic murmur, no pericardial rub. Abdomen: Soft, nontender, no hepatomegaly, bowel sounds present, no guarding or rebound. Extremities: No pitting edema, distal pulses 2+. Skin: Warm and dry. Musculoskeletal: No kyphosis. Neuropsychiatric: Alert and oriented x3, affect grossly appropriate.  ECG:  An ECG dated 03/01/2022 was personally reviewed today and demonstrated:  NSR  Recent Labwork: 09/16/2021: ALT 18; AST 32; TSH 2.178 09/18/2021: Hemoglobin 11.2; Platelets 167 09/29/2021: B Natriuretic Peptide 350.8; BUN 8; Creatinine, Ser 0.69; Potassium 4.0; Sodium 141     Component Value Date/Time   CHOL 215 (H) 09/17/2021 0553   CHOL 293 (H) 01/26/2020 1312   CHOL 218 07/18/2011 1245   TRIG 74 09/17/2021 0553   TRIG 112 07/18/2011 1245   HDL 43 09/17/2021 0553   HDL 51 01/26/2020 1312   CHOLHDL 5.0 09/17/2021 0553   VLDL 15 09/17/2021 0553   LDLCALC 157 (H) 09/17/2021 0553   LDLCALC 218 (H) 01/26/2020 1312    Other Studies Reviewed Today: Echo in 01/2022 LVEF 55-60%  Echo in 08/2021 LVEF 35%  LHC in 08/2021   Mid LAD lesion is 50% stenosed.   There is moderate to severe left ventricular systolic dysfunction.   LV end diastolic pressure is moderately elevated.   The left ventricular ejection fraction is 25-35% by visual estimate.  Assessment and Plan: Patient is a 51 year old F known to have HF improved EF (Takotsubo cardiomyopathy in May 2023 with a EF 30 to 35% improved to 55 to 60% in September 2023), moderate CAD (mid LAD 50% stenosis), HLD and tobacco abuse who presents to the cardiology clinic today for follow-up of Takotsubo  cardiomyopathy.  #HF improved EF (Takotsubo cardiomyopathy in May 2023 with a EF 30 to 35% improved to 55 to 60% in September 2023), currently compensated Plan -Continue carvedilol 3.125 mg twice daily -Patient currently off Entresto due to insurance denial. We will request for prior authorization for [facility] approval. In the meantime we will prescribe losartan 25 mg once daily. Patient can switch to [facility] upon approval. -Continue Farxiga 10 mg once daily  #Moderate CAD (mid LAD 50% stenosis), angina free Plan -Continue aspirin 81 mg once daily and atorvastatin 40 mg nightly  #HLD, not on goal (goal less than 100) Plan -Continue atorvastatin 40 mg nightly.  LDL 120 from October 2023.  #Nicotine abuse Plan -Smoking cessation counseling provided. Patient is trying to quit smoking through nicotine patches.  I have spent a total of 32 minutes with patient reviewing chart, EKGs, labs and examining patient as well as establishing an assessment and plan that was discussed with the patient.  > 50%  of time was spent in direct patient care.      Medication Adjustments/Labs and Tests Ordered: Current medicines are reviewed at length with the patient today.  Concerns regarding medicines are outlined above.   Tests Ordered: No orders of the defined types were placed in this encounter.   Medication Changes: No orders of the defined types were placed in this encounter.   Disposition:  Follow up  one year  Signed Lisaanne Lawrie Verne Spurr, MD, 03/01/2022 1:16 PM    [facility] [address] [place], [place], [place] [address]

## 2022-03-23 ENCOUNTER — Other Ambulatory Visit (HOSPITAL_COMMUNITY): Payer: Self-pay

## 2022-04-10 ENCOUNTER — Telehealth: Payer: Self-pay | Admitting: *Deleted

## 2022-04-10 MED ORDER — ENTRESTO 24-26 MG PO TABS: 1.0000 | ORAL_TABLET | Freq: Two times a day (BID) | ORAL | 6 refills | Status: DC

## 2022-04-10 NOTE — Telephone Encounter (Signed)
Fax notification received from [facility] that entresto 24/26 mg approved 04/10/2022 through 04/11/2023. Patient aware and says pharmacy has co-pay assistance card on file. Patient aware not to fill losartan and she no longer needs this prescription and she is to stay on entresto

## 2022-04-11 ENCOUNTER — Telehealth: Payer: Self-pay

## 2022-04-11 NOTE — Telephone Encounter (Signed)
Error

## 2022-07-30 ENCOUNTER — Ambulatory Visit: Payer: BC Managed Care – PPO | Attending: Internal Medicine | Admitting: Nurse Practitioner

## 2022-07-30 ENCOUNTER — Encounter: Payer: Self-pay | Admitting: Nurse Practitioner

## 2022-07-30 VITALS — BP 120/88 | HR 75 | Ht 68.0 in | Wt 166.2 lb

## 2022-07-30 DIAGNOSIS — I5181 Takotsubo syndrome: Secondary | ICD-10-CM

## 2022-07-30 DIAGNOSIS — I2510 Atherosclerotic heart disease of native coronary artery without angina pectoris: Secondary | ICD-10-CM

## 2022-07-30 DIAGNOSIS — I5032 Chronic diastolic (congestive) heart failure: Secondary | ICD-10-CM | POA: Diagnosis not present

## 2022-07-30 DIAGNOSIS — Z720 Tobacco use: Secondary | ICD-10-CM

## 2022-07-30 DIAGNOSIS — E785 Hyperlipidemia, unspecified: Secondary | ICD-10-CM | POA: Diagnosis not present

## 2022-07-30 MED ORDER — DAPAGLIFLOZIN PROPANEDIOL 10 MG PO TABS: 10.0000 mg | ORAL_TABLET | Freq: Every day | ORAL | 1 refills | Status: DC

## 2022-07-30 NOTE — Patient Instructions (Signed)

## 2022-07-30 NOTE — Progress Notes (Signed)
Office Visit    Patient Name: Carrie Jordan Date of Encounter: 07/30/2022  PCP:  Practice, [facility]  Cardiologist:  Chalmers Guest, MD  Advanced Practice Provider:  No care team member to display Electrophysiologist:  None   Chief Complaint    Carrie Jordan is a very pleasant 52 y.o. 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   Past Medical History    Past Medical History:  Diagnosis Date   Angio-edema    GERD (gastroesophageal reflux disease)    Migraines    Takotsubo cardiomyopathy    a. EF at 30-35% by echo in 08/2021 and nonobstructive CAD by cath   Past Surgical History:  Procedure Laterality Date   APPENDECTOMY     CORONARY/GRAFT ACUTE MI REVASCULARIZATION N/A 09/16/2021   Procedure: Coronary/Graft Acute MI Revascularization;  Surgeon: Lorretta Harp, MD;  Location: [facility];  Service: Cardiovascular;  Laterality: N/A;   LEFT HEART CATH AND CORONARY ANGIOGRAPHY N/A 09/16/2021   Procedure: LEFT HEART CATH AND CORONARY ANGIOGRAPHY;  Surgeon: Lorretta Harp, MD;  Location: [facility];  Service: Cardiovascular;  Laterality: N/A;   TUBAL LIGATION      Allergies  Allergies  Allergen Reactions   Codeine Nausea And Vomiting   Crestor [Rosuvastatin Calcium] Other (See Comments)    Leg cramps     History of Present Illness    Carrie Jordan is a very pleasant 52 y.o. female with a PMH as mentioned above.  Last seen by Dr. Dellia Cloud early November 2023.  Overall was doing well from a cardiac perspective.  At that time, she was currently off Entresto due to insurance denial, prior authorization was requested.  In the meantime, she was prescribed losartan 25 mg daily.  Today she presents for 56-month follow-up.  She states she is doing well from a cardiac perspective.  Sadly, her husband  passed away last December [id number] from complications of pneumonia.  She is coping well. Denies any chest pain, shortness of breath, palpitations, syncope, presyncope, dizziness, orthopnea, PND, swelling or significant weight changes, acute bleeding, or claudication.  Tolerating her medications well, however states she has not been consistent with taking Farxiga-concerned she was having UTI symptoms with this medicine - specifically low back pain, denies any symptoms today.  SH: Works for [employer] Studies Reviewed:   The following studies were reviewed today:   EKG:  EKG is not ordered today.    Limited echo on February 21, 2022.  1. Left ventricular ejection fraction, by estimation, is 55 to 60%. The  left ventricle has normal function. The left ventricle has no regional  wall motion abnormalities.   2. Right ventricular systolic function is normal. The right ventricular  size is normal.   3. The mitral valve is normal in structure.   4. The aortic valve is normal in structure.   5. The inferior vena cava is normal in size with greater than 50%  respiratory variability, suggesting right atrial pressure of 3 mmHg.   Comparison(s): Prior 09/17/21: 30-35% EF Takotsubo.  Left heart cath on Sep 16, 2021:   Mid LAD lesion is 50% stenosed.   There is moderate to severe left ventricular systolic dysfunction.   LV end diastolic pressure is moderately elevated.   The left ventricular ejection fraction is 25-35% by visual estimate.  Recent Labs: 09/16/2021: ALT 18; TSH 2.178 09/18/2021: Hemoglobin 11.2; Platelets 167 09/29/2021: B Natriuretic Peptide 350.8; BUN 8; Creatinine, Ser 0.69; Potassium 4.0; Sodium 141  Recent Lipid Panel    Component Value Date/Time   CHOL 215 (H) 09/17/2021 0553   CHOL 293 (H) 01/26/2020 1312   CHOL 218 07/18/2011 1245   TRIG 74 09/17/2021 0553   TRIG 112 07/18/2011 1245   HDL 43 09/17/2021 0553   HDL 51 01/26/2020 1312   CHOLHDL  5.0 09/17/2021 0553   VLDL 15 09/17/2021 0553   LDLCALC 157 (H) 09/17/2021 0553   LDLCALC 218 (H) 01/26/2020 1312     Home Medications   Current Meds  Medication Sig   aspirin EC 81 MG tablet Take 1 tablet (81 mg total) by mouth daily. Swallow whole.   atorvastatin (LIPITOR) 40 MG tablet Take 1 tablet (40 mg total) by mouth daily at 6 PM.   carvedilol (COREG) 3.125 MG tablet Take 1 tablet (3.125 mg total) by mouth 2 (two) times daily with a meal.   nitroGLYCERIN (NITROSTAT) 0.4 MG SL tablet Place 1 tablet (0.4 mg total) under the tongue every 5 (five) minutes x 3 doses as needed for chest pain.   pantoprazole (PROTONIX) 40 MG tablet Take 1 tablet by mouth daily.   sacubitril-valsartan (ENTRESTO) 24-26 MG Take 1 tablet by mouth 2 (two) times daily.   dapagliflozin propanediol (FARXIGA) 10 MG TABS tablet Take 1 tablet (10 mg total) by mouth daily before breakfast.     Review of Systems    All other systems reviewed and are otherwise negative except as noted above.  Physical Exam    VS:  BP 120/88 (BP Location: Left Arm, Patient Position: Sitting, Cuff Size: Normal)   Pulse 75   Ht 5\' 8"  (1.727 m)   Wt 166 lb 3.2 oz (75.4 kg)   LMP 01/07/2017   SpO2 96%   BMI 25.27 kg/m  , BMI Body mass index is 25.27 kg/m.  Wt Readings from Last 3 Encounters:  07/30/22 166 lb 3.2 oz (75.4 kg)  03/01/22 166 lb 6.4 oz (75.5 kg)  01/17/22 167 lb 9.6 oz (76 kg)     GEN: Well nourished, well developed, 52 year old female in no acute distress. HEENT: normal. Neck: Supple, no JVD, carotid bruits, or masses. Cardiac: S1/S2, RRR, no murmurs, rubs, or gallops. No clubbing, cyanosis, edema.  Radials/PT 2+ and equal bilaterally.  Respiratory:  Respirations regular and unlabored, clear to auscultation bilaterally. MS: No deformity or atrophy. Skin: Warm and dry, no rash. Neuro:  Strength and sensation are intact. Psych: Normal affect.  Assessment & Plan    HFimpEF, Takotsubo  cardiomyopathy Limited echo in 01/2022 revealed recovered EF.  Euvolemic and well compensated on exam.  Compliant with medications, however there was some concern with Farxiga-admits she has not been consistent with taking this medicine.  States she does not have a significant history of recurrent UTIs.  Will request labs obtained from December 2023 with primary care provider.  If kidney function/labs permit, will will recommend we trial Farxiga for 1 month to see how she tolerates this.  She verbalized understanding.  Continue current GDMT. Low sodium diet, fluid restriction <2L, and daily weights encouraged. Educated to contact our office for weight gain of 2 lbs overnight or 5 lbs in one week.  Will refill Farxiga per her request. Heart healthy diet and regular cardiovascular exercise encouraged.   CAD Stable with no anginal symptoms. No indication for ischemic evaluation.  Continue aspirin, atorvastatin, carvedilol, and nitroglycerin as needed. Heart healthy diet and regular cardiovascular exercise encouraged.   Hyperlipidemia Labs obtained with PCP from 03/2022.  Will request these labs to be sent to [place].  Continue atorvastatin. Heart healthy diet and regular cardiovascular exercise encouraged.   Tobacco abuse Formerly quit tobacco, states she is currently using.  Smoking cessation encouraged and discussed.  Disposition: Follow up in 6 month(s) with Vishnu P Mallipeddi, MD or APP.  Signed, Finis Bud, NP 07/30/2022, 11:18 AM [place]

## 2022-08-06 ENCOUNTER — Ambulatory Visit: Payer: BC Managed Care – PPO | Admitting: Internal Medicine

## 2022-12-28 ENCOUNTER — Other Ambulatory Visit: Payer: Self-pay

## 2022-12-28 MED ORDER — ENTRESTO 24-26 MG PO TABS: 1.0000 | ORAL_TABLET | Freq: Two times a day (BID) | ORAL | 5 refills | Status: DC

## 2023-01-29 ENCOUNTER — Ambulatory Visit: Payer: BC Managed Care – PPO | Attending: Nurse Practitioner | Admitting: Nurse Practitioner

## 2023-01-29 ENCOUNTER — Encounter: Payer: Self-pay | Admitting: Nurse Practitioner

## 2023-01-29 VITALS — BP 120/68 | HR 76 | Ht 68.0 in | Wt 168.0 lb

## 2023-01-29 DIAGNOSIS — R079 Chest pain, unspecified: Secondary | ICD-10-CM

## 2023-01-29 DIAGNOSIS — I25119 Atherosclerotic heart disease of native coronary artery with unspecified angina pectoris: Secondary | ICD-10-CM | POA: Diagnosis not present

## 2023-01-29 DIAGNOSIS — Z720 Tobacco use: Secondary | ICD-10-CM

## 2023-01-29 DIAGNOSIS — I5032 Chronic diastolic (congestive) heart failure: Secondary | ICD-10-CM

## 2023-01-29 DIAGNOSIS — I5181 Takotsubo syndrome: Secondary | ICD-10-CM | POA: Diagnosis not present

## 2023-01-29 DIAGNOSIS — E785 Hyperlipidemia, unspecified: Secondary | ICD-10-CM

## 2023-01-29 MED ORDER — CARVEDILOL 6.25 MG PO TABS: 6.2500 mg | ORAL_TABLET | Freq: Two times a day (BID) | ORAL | 3 refills | Status: DC

## 2023-01-29 MED ORDER — DAPAGLIFLOZIN PROPANEDIOL 10 MG PO TABS: 10.0000 mg | ORAL_TABLET | Freq: Every day | ORAL | Status: DC

## 2023-01-29 MED ORDER — DAPAGLIFLOZIN PROPANEDIOL 10 MG PO TABS: 10.0000 mg | ORAL_TABLET | Freq: Every day | ORAL | 1 refills | Status: DC

## 2023-01-29 NOTE — Progress Notes (Unsigned)
Office Visit    Patient Name: Carrie Jordan Date of Encounter: 01/29/2023 PCP:  Practice, [facility]  Cardiologist:  Marjo Bicker, MD  Advanced Practice Provider:  No care team member to display Electrophysiologist:  None   Chief Complaint and HPI    Carrie Jordan is a very pleasant 52 y.o. 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   Last seen by Dr. Jenene Slicker early November 2023.  Overall was doing well from a cardiac perspective.  At that time, she was currently off Entresto due to insurance denial, prior authorization was requested.  In the meantime, she was prescribed losartan 25 mg daily.  I last saw her for 6 month f/u on July 30, 2022. Was doing well from a cardiac perspective.  Sadly, her husband passed away last 2023-12-31from complications of pneumonia. Was coping well. Was tolerating her medications well, however stated she was not consistent with taking Farxiga-concerned she was having UTI symptoms with this medicine - specifically low back pain.   Today she presents for follow-up. Has been getting over a URI recently. Also endorses noticing some chest heaviness with exertion and shortness of breath noted along left collarbone and goes to her left arm. Typically occurs if she overworks. Said last time this happened she was doing some intense activities and working in garage and went to sit on porch and symptoms were relieved. Denies any palpitations, syncope, presyncope, dizziness, orthopnea, PND, swelling or significant weight changes, acute bleeding, or claudication. Compliant with her medications.   SH: Works for [employer] Studies Reviewed:   The following studies were reviewed today:   EKG:  EKG is not ordered today.    Limited echo on February 21, 2022.  1. Left ventricular  ejection fraction, by estimation, is 55 to 60%. The  left ventricle has normal function. The left ventricle has no regional  wall motion abnormalities.   2. Right ventricular systolic function is normal. The right ventricular  size is normal.   3. The mitral valve is normal in structure.   4. The aortic valve is normal in structure.   5. The inferior vena cava is normal in size with greater than 50%  respiratory variability, suggesting right atrial pressure of 3 mmHg.   Comparison(s): Prior 09/17/21: 30-35% EF Takotsubo.  Left heart cath on Sep 16, 2021:   Mid LAD lesion is 50% stenosed.   There is moderate to severe left ventricular systolic dysfunction.   LV end diastolic pressure is moderately elevated.   The left ventricular ejection fraction is 25-35% by visual estimate.  Review of Systems    All other systems reviewed and are otherwise negative except as noted above.  Physical Exam    VS:  BP 120/68   Pulse 76   Ht 5\' 8"  (1.727 m)   Wt 168 lb (76.2 kg)   LMP 01/07/2017   SpO2 98%   BMI 25.54 kg/m  , BMI Body mass index is 25.54 kg/m.  Wt Readings from Last 3 Encounters:  01/29/23 168 lb (76.2 kg)  07/30/22 166 lb 3.2 oz (75.4 kg)  03/01/22 166 lb 6.4 oz (75.5 kg)     GEN: Well nourished, well developed, 52 year old female in no acute distress. HEENT: normal. Neck: Supple, no JVD, carotid bruits, or masses. Cardiac: S1/S2, RRR, no murmurs, rubs, or gallops. No clubbing,  cyanosis, edema.  Radials/PT 2+ and equal bilaterally.  Respiratory:  Respirations regular and unlabored, clear to auscultation bilaterally. MS: No deformity or atrophy. Skin: Warm and dry, no rash. Neuro:  Strength and sensation are intact. Psych: Normal affect.  Assessment & Plan    HFimpEF, Takotsubo cardiomyopathy Stage C, NYHA class I-II symptoms, SHOB with intense activities. Limited echo in 01/2022 revealed recovered EF.  Euvolemic and well compensated on exam.  Compliant with  medications, however there was some concern with Marcelline Deist- stopped taking this medication as she was concerned she had UTI/yeast infection. Will hold medication at this time. Will update limited Echo and increase Coreg to 6.25 mg BID.  Continue rest of GDMT. Low sodium diet, fluid restriction <2L, and daily weights encouraged. Educated to contact our office for weight gain of 2 lbs overnight or 5 lbs in one week.  Heart healthy diet and regular cardiovascular exercise encouraged.   CAD, chest pain of uncertain etiology Admits to chest heaviness with exertion with intense activities. LHC reviewed in 08/2021 that revealed mLAD 50% stenosis. Updating limited Echo as mentioned above. Will continue to monitor at this time. Increasing Coreg as mentioned above.   Continue aspirin, atorvastatin, and nitroglycerin as needed. Heart healthy diet and regular cardiovascular exercise encouraged. Care and ED precautions discussed.   Hyperlipidemia Labs stable that were faxed from PCP this year. Continue atorvastatin. Heart healthy diet and regular cardiovascular exercise encouraged.   Tobacco abuse Smoking cessation encouraged and discussed.  Disposition: Follow up in 6-8 weeks with Vishnu P Mallipeddi, MD or APP.  Signed, Sharlene Dory, NP

## 2023-01-29 NOTE — Patient Instructions (Addendum)
Medication Instructions:  Your physician has recommended you make the following change in your medication:  Continue holding farxiga Increase carvedilol to 6.25 mg twice daily Continue all other medications as prescribed  Labwork: none  Testing/Procedures: Limited Echocardiogram  Follow-Up: Your physician recommends that you schedule a follow-up appointment in: 6-8 weeks  Any Other Special Instructions Will Be Listed Below (If Applicable).  If you need a refill on your cardiac medications before your next appointment, please call your pharmacy.

## 2023-02-11 ENCOUNTER — Ambulatory Visit: Payer: BC Managed Care – PPO | Attending: Nurse Practitioner

## 2023-02-11 DIAGNOSIS — I5181 Takotsubo syndrome: Secondary | ICD-10-CM

## 2023-02-12 LAB — ECHOCARDIOGRAM LIMITED
Area-P 1/2: 2.97 cm2
Calc EF: 64.7 %
S' Lateral: 3.2 cm
Single Plane A2C EF: 72.6 %
Single Plane A4C EF: 55.9 %

## 2023-02-25 ENCOUNTER — Other Ambulatory Visit: Payer: Self-pay | Admitting: Medical Genetics

## 2023-02-25 DIAGNOSIS — Z006 Encounter for examination for normal comparison and control in clinical research program: Secondary | ICD-10-CM

## 2023-03-05 ENCOUNTER — Other Ambulatory Visit (HOSPITAL_COMMUNITY): Payer: BC Managed Care – PPO | Attending: Medical Genetics

## 2023-03-25 ENCOUNTER — Ambulatory Visit: Payer: BC Managed Care – PPO | Attending: Nurse Practitioner | Admitting: Nurse Practitioner

## 2023-03-25 ENCOUNTER — Encounter: Payer: Self-pay | Admitting: Nurse Practitioner

## 2023-03-25 VITALS — BP 122/72 | HR 72 | Ht 68.0 in | Wt 170.0 lb

## 2023-03-25 DIAGNOSIS — E785 Hyperlipidemia, unspecified: Secondary | ICD-10-CM | POA: Diagnosis not present

## 2023-03-25 DIAGNOSIS — I2510 Atherosclerotic heart disease of native coronary artery without angina pectoris: Secondary | ICD-10-CM | POA: Diagnosis not present

## 2023-03-25 DIAGNOSIS — I5181 Takotsubo syndrome: Secondary | ICD-10-CM | POA: Diagnosis not present

## 2023-03-25 DIAGNOSIS — I5032 Chronic diastolic (congestive) heart failure: Secondary | ICD-10-CM | POA: Diagnosis not present

## 2023-03-25 DIAGNOSIS — R0989 Other specified symptoms and signs involving the circulatory and respiratory systems: Secondary | ICD-10-CM

## 2023-03-25 DIAGNOSIS — I213 ST elevation (STEMI) myocardial infarction of unspecified site: Secondary | ICD-10-CM

## 2023-03-25 DIAGNOSIS — Z720 Tobacco use: Secondary | ICD-10-CM

## 2023-03-25 MED ORDER — ASPIRIN 81 MG PO CHEW: 81.0000 mg | CHEWABLE_TABLET | Freq: Every day | ORAL | Status: AC

## 2023-03-25 NOTE — Patient Instructions (Addendum)
Medication Instructions:  Your physician has recommended you make the following change in your medication:  Please start Asprin 81 Mg daily   Labwork: None   Testing/Procedures: Your physician has requested that you have a carotid duplex. This test is an ultrasound of the carotid arteries in your neck. It looks at blood flow through these arteries that supply the brain with blood. Allow one hour for this exam. There are no restrictions or special instructions.  Follow-Up: Your physician recommends that you schedule a follow-up appointment in: 6 Months   Any Other Special Instructions Will Be Listed Below (If Applicable).  If you need a refill on your cardiac medications before your next appointment, please call your pharmacy.

## 2023-03-25 NOTE — Progress Notes (Signed)
Office Visit    Patient Name: Carrie Jordan Date of Encounter: 03/25/2023 PCP:  Practice, [facility]  Cardiologist:  Marjo Bicker, MD  Advanced Practice Provider:  No care team member to display Electrophysiologist:  None   Chief Complaint and HPI    [name] is a very pleasant 52 y.o. 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   Last seen by Dr. Jenene Slicker early November 2023.  Overall was doing well from a cardiac perspective.  At that time, she was currently off Entresto due to insurance denial, prior authorization was requested.  In the meantime, she was prescribed losartan 25 mg daily.  Last seen for follow-up on January 29, 2023. Had been getting over a URI recently. Also endorsed noticing some chest heaviness with exertion and shortness of breath noted along left collarbone and radiating into her left arm. Typically occurred if she overworked. Said last time this happened she was doing some intense activities and working in garage and went to sit on porch and symptoms were relieved. Denied any palpitations, syncope, presyncope, dizziness, orthopnea, PND, swelling or significant weight changes, acute bleeding, or claudication. Compliant with her medications.   Today she presents for follow-up.  She is doing well and denies any acute cardiac plaints or issues.  She is wanting to know if she should return to taking aspirin 81 mg daily. Denies any chest pain, shortness of breath, palpitations, syncope, presyncope, dizziness, orthopnea, PND, swelling or significant weight changes, acute bleeding, or claudication.   SH: Works for [employer] Studies Reviewed:   The following studies were reviewed today:   EKG:  EKG Interpretation Date/Time:  Monday March 25 2023 11:24:37 EST Ventricular  Rate:  70 PR Interval:  202 QRS Duration:  68 QT Interval:  386 QTC Calculation: 416 R Axis:   92  Text Interpretation: Normal sinus rhythm Rightward axis Septal infarct (cited on or before 27-Oct-2021) When compared with ECG of 27-Oct-2021 12:30, Questionable change in initial forces of Anterior leads Non-specific change in ST segment in Inferior leads T wave inversion no longer evident in Inferior leads T wave inversion no longer evident in Anterolateral leads Confirmed by Sharlene Dory [phone]) on 03/25/2023 11:40:13 AM   Limited echo on February 21, 2022.  1. Left ventricular ejection fraction, by estimation, is 55 to 60%. The  left ventricle has normal function. The left ventricle has no regional  wall motion abnormalities.   2. Right ventricular systolic function is normal. The right ventricular  size is normal.   3. The mitral valve is normal in structure.   4. The aortic valve is normal in structure.   5. The inferior vena cava is normal in size with greater than 50%  respiratory variability, suggesting right atrial pressure of 3 mmHg.   Comparison(s): Prior 09/17/21: 30-35% EF Takotsubo.  Left heart cath on Sep 16, 2021:   Mid LAD lesion is 50% stenosed.   There is moderate to severe left ventricular systolic dysfunction.   LV end diastolic pressure is moderately elevated.   The left ventricular ejection fraction is 25-35% by visual estimate.  Review of Systems    All other systems reviewed and are otherwise negative except as noted above.  Physical Exam    VS:  BP 122/72   Pulse 72   Ht 5\' 8"  (1.727 m)   Wt  170 lb (77.1 kg)   LMP 01/07/2017   SpO2 97%   BMI 25.85 kg/m  , BMI Body mass index is 25.85 kg/m.  Wt Readings from Last 3 Encounters:  03/25/23 170 lb (77.1 kg)  01/29/23 168 lb (76.2 kg)  07/30/22 166 lb 3.2 oz (75.4 kg)     GEN: Well nourished, well developed, 52 year old female in no acute distress. HEENT: normal. Neck: Supple, no JVD, no masses.   Right carotid bruit noted on exam, no left carotid bruit. Cardiac: S1/S2, RRR, no murmurs, rubs, or gallops. No clubbing, cyanosis, edema.  Radials/PT 2+ and equal bilaterally.  Respiratory:  Respirations regular and unlabored, clear to auscultation bilaterally. MS: No deformity or atrophy. Skin: Warm and dry, no rash. Neuro:  Strength and sensation are intact. Psych: Normal affect.  Assessment & Plan    HFimpEF, Takotsubo cardiomyopathy Stage C, NYHA class I-II symptoms. Limited echo in 01/2022 revealed recovered EF.  Euvolemic and well compensated on exam.  Compliant with medications.  Continue current medication regimen.  She was unable to tolerate Farxiga in the past due to some UTI/yeast infection symptoms. Low sodium diet, fluid restriction <2L, and daily weights encouraged. Educated to contact our office for weight gain of 2 lbs overnight or 5 lbs in one week.  Heart healthy diet and regular cardiovascular exercise encouraged.   CAD Stable with no anginal symptoms. No indication for ischemic evaluation. LHC reviewed in 08/2021 that revealed mLAD 50% stenosis.  Aspirin is not listed on her medication list.  Instructed her to restart aspirin 81 mg daily and continue rest of medication regimen. Heart healthy diet and regular cardiovascular exercise encouraged. Care and ED precautions discussed.   Hyperlipidemia Labs stable that were faxed from PCP this year. Continue atorvastatin. Heart healthy diet and regular cardiovascular exercise encouraged.  She has upcoming lab work with her PCP next January-I have requested that these labs are faxed to [place] when these have resulted.  She verbalized understanding.  Tobacco abuse Smoking cessation encouraged and discussed.  5.  Right carotid bruit Noted on exam today.  Will arrange carotid duplex for further evaluation for carotid artery disease.  Continue current medication regimen.   Disposition: Follow up in 6 months with Vishnu P Mallipeddi, MD or  APP.  Signed, Sharlene Dory, NP

## 2023-03-26 ENCOUNTER — Other Ambulatory Visit: Payer: Self-pay | Admitting: Student

## 2023-04-18 ENCOUNTER — Ambulatory Visit: Payer: BC Managed Care – PPO | Attending: Nurse Practitioner

## 2023-04-18 DIAGNOSIS — R0989 Other specified symptoms and signs involving the circulatory and respiratory systems: Secondary | ICD-10-CM | POA: Diagnosis not present

## 2023-06-06 ENCOUNTER — Telehealth: Payer: Self-pay

## 2023-06-06 ENCOUNTER — Other Ambulatory Visit (HOSPITAL_COMMUNITY): Payer: Self-pay

## 2023-06-06 NOTE — Telephone Encounter (Signed)
 Pharmacy Patient Advocate Encounter   Received notification from CoverMyMeds that prior authorization for ENTRESTO  is required/requested.   Insurance verification completed.   The patient is insured through [facility] .   Per test claim: PA required; PA submitted to above mentioned insurance via CoverMyMeds Key/confirmation #/EOC [id number] Status is pending

## 2023-06-07 ENCOUNTER — Other Ambulatory Visit (HOSPITAL_COMMUNITY): Payer: Self-pay

## 2023-06-07 NOTE — Telephone Encounter (Signed)
 Pharmacy Patient Advocate Encounter  Received notification from [facility] that Prior Authorization for ENTRESTO  has been DENIED.  Full denial letter will be uploaded to the media tab. See denial reason below.  Plan only?covers this drug when you have or had spontaneous or provokable increased left ventricular filling pressures.

## 2023-06-10 ENCOUNTER — Telehealth: Payer: Self-pay

## 2023-06-10 ENCOUNTER — Encounter: Payer: Self-pay | Admitting: Pharmacist

## 2023-06-10 ENCOUNTER — Other Ambulatory Visit (HOSPITAL_COMMUNITY): Payer: Self-pay

## 2023-06-10 NOTE — Telephone Encounter (Signed)
 PA request has been Submitted. New Encounter created for follow up. For additional info see Pharmacy Prior Auth telephone encounter from 06/10/23.

## 2023-06-10 NOTE — Telephone Encounter (Signed)
 Pharmacy Patient Advocate Encounter   Received notification from Physician's Office that prior authorization for ENTRESTO  is required/requested.   Insurance verification completed.   The patient is insured through [facility] .   Per test claim: PA required; PA submitted to above mentioned insurance via CoverMyMeds Key/confirmation #/EOC [id number] Status is pending

## 2023-06-10 NOTE — Telephone Encounter (Signed)
 Can we please submit her echo from May 2023? Thanks

## 2023-06-10 NOTE — Telephone Encounter (Signed)
 Pharmacy Patient Advocate Encounter  Received notification from [facility] that Prior Authorization for ENTRESTO  has been APPROVED from 06/10/23 to 06/09/24. Ran test claim, Copay is $47. This test claim was processed through [facility]- copay amounts may vary at other pharmacies due to pharmacy/plan contracts, or as the patient moves through the different stages of their insurance plan.

## 2023-09-18 ENCOUNTER — Ambulatory Visit: Payer: BC Managed Care – PPO | Attending: Internal Medicine | Admitting: Internal Medicine

## 2023-09-18 ENCOUNTER — Encounter: Payer: Self-pay | Admitting: Internal Medicine

## 2023-09-18 VITALS — BP 118/70 | HR 69 | Ht 68.0 in | Wt 171.0 lb

## 2023-09-18 DIAGNOSIS — I5181 Takotsubo syndrome: Secondary | ICD-10-CM | POA: Diagnosis not present

## 2023-09-18 DIAGNOSIS — I5032 Chronic diastolic (congestive) heart failure: Secondary | ICD-10-CM | POA: Diagnosis not present

## 2023-09-18 MED ORDER — ENTRESTO 24-26 MG PO TABS: 1.0000 | ORAL_TABLET | Freq: Two times a day (BID) | ORAL | 5 refills | Status: AC

## 2023-09-18 MED ORDER — ATORVASTATIN CALCIUM 40 MG PO TABS: 40.0000 mg | ORAL_TABLET | Freq: Every day | ORAL | 3 refills | Status: AC

## 2023-09-18 MED ORDER — CARVEDILOL 6.25 MG PO TABS: 6.2500 mg | ORAL_TABLET | Freq: Two times a day (BID) | ORAL | 3 refills | Status: AC

## 2023-09-18 NOTE — Patient Instructions (Signed)
Medication Instructions:  Your physician recommends that you continue on your current medications as directed. Please refer to the Current Medication list given to you today.   Labwork: None  Testing/Procedures: None  Follow-Up: Your physician recommends that you schedule a follow-up appointment in: 2 years. You will receive a reminder call in about 18 months reminding you to schedule your appointment. If you don't receive this call, please contact our office.   Any Other Special Instructions Will Be Listed Below (If Applicable).  Thank you for choosing [facility]!      If you need a refill on your cardiac medications before your next appointment, please call your pharmacy.

## 2023-09-18 NOTE — Progress Notes (Signed)
 Cardiology Office Note  Date: 09/18/2023   ID: Ritu Gagliardo, DOB 1970-11-16, MRN [id number]  PCP:  [facility]  Cardiologist:  Lasalle Pointer, MD Electrophysiologist:  None   Reason for Office Visit: Follow-up of Takotsubo cardiomyopathy   History of Present Illness: Carrie Jordan is a 53 y.o. female known to have HF improved EF (Takotsubo cardiomyopathy in May 2023 with a EF 30 to 35% improved to 55 to 60% in September 2023), moderate CAD (mid LAD 50% stenosis), HLD and tobacco abuse who presents to the cardiology clinic today for follow-up of Takotsubo cardiomyopathy.  Overall doing great.  Did not tolerate Farxiga  due to UTI/infections.  She complained of some chest pains lasting for a few seconds when she was pushing some furniture.  No chest pain with exertion.  No DOE, dizziness, lightheadedness, syncope, palpitations or leg swelling.   Past Medical History:  Diagnosis Date   Angio-edema    GERD (gastroesophageal reflux disease)    Migraines    Takotsubo cardiomyopathy    a. EF at 30-35% by echo in 08/2021 and nonobstructive CAD by cath    Past Surgical History:  Procedure Laterality Date   APPENDECTOMY     CORONARY/GRAFT ACUTE MI REVASCULARIZATION N/A 09/16/2021   Procedure: Coronary/Graft Acute MI Revascularization;  Surgeon: Avanell Leigh, MD;  Location: [facility] INVASIVE CV LAB;  Service: Cardiovascular;  Laterality: N/A;   LEFT HEART CATH AND CORONARY ANGIOGRAPHY N/A 09/16/2021   Procedure: LEFT HEART CATH AND CORONARY ANGIOGRAPHY;  Surgeon: Avanell Leigh, MD;  Location: [facility] INVASIVE CV LAB;  Service: Cardiovascular;  Laterality: N/A;   TUBAL LIGATION      Current Outpatient Medications  Medication Sig Dispense Refill   aspirin  (ASPIRIN  81) 81 MG chewable tablet Chew 1 tablet (81 mg total) by mouth daily.     escitalopram  (LEXAPRO ) 10 MG tablet Take 10 mg by mouth daily.     nitroGLYCERIN  (NITROSTAT ) 0.4 MG SL tablet Place 1 tablet  (0.4 mg total) under the tongue every 5 (five) minutes x 3 doses as needed for chest pain. 25 tablet 1   pantoprazole  (PROTONIX ) 40 MG tablet TAKE ONE TABLET BY MOUTH DAILY 90 tablet 3   atorvastatin  (LIPITOR) 40 MG tablet Take 1 tablet (40 mg total) by mouth daily at 6 PM. 90 tablet 3   carvedilol  (COREG ) 6.25 MG tablet Take 1 tablet (6.25 mg total) by mouth 2 (two) times daily. 180 tablet 3   sacubitril-valsartan (ENTRESTO ) 24-26 MG Take 1 tablet by mouth 2 (two) times daily. 60 tablet 5   No current facility-administered medications for this visit.   Allergies:  Codeine and Crestor  [rosuvastatin  calcium ]   Social History: The patient  reports that she has been smoking cigarettes. She has never been exposed to tobacco smoke. She has never used smokeless tobacco. She reports that she does not currently use alcohol. She reports that she does not use drugs.   Family History: The patient's family history includes Allergic rhinitis in her sister; Cancer in her paternal grandmother; Colon cancer (age of onset: 37) in her maternal grandmother; Thyroid  disease in her mother.   ROS:  Please see the history of present illness. Otherwise, complete review of systems is positive for none.  All other systems are reviewed and negative.   Physical Exam: VS:  BP 118/70   Pulse 69   Ht 5\' 8"  (1.727 m)   Wt 171 lb (77.6 kg)   LMP 01/07/2017   SpO2  99%   BMI 26.00 kg/m , BMI Body mass index is 26 kg/m.  Wt Readings from Last 3 Encounters:  09/18/23 171 lb (77.6 kg)  03/25/23 170 lb (77.1 kg)  01/29/23 168 lb (76.2 kg)    General: Patient appears comfortable at rest. HEENT: Conjunctiva and lids normal, oropharynx clear with moist mucosa. Neck: Supple, no elevated JVP or carotid bruits, no thyromegaly. Lungs: Clear to auscultation, nonlabored breathing at rest. Cardiac: Regular rate and rhythm, no S3 or significant systolic murmur, no pericardial rub. Abdomen: Soft, nontender, no hepatomegaly, bowel  sounds present, no guarding or rebound. Extremities: No pitting edema, distal pulses 2+. Skin: Warm and dry. Musculoskeletal: No kyphosis. Neuropsychiatric: Alert and oriented x3, affect grossly appropriate.  ECG:  An ECG dated 03/01/2022 was personally reviewed today and demonstrated:  NSR  Recent Labwork: No results found for requested labs within last 365 days.     Component Value Date/Time   CHOL 215 (H) 09/17/2021 0553   CHOL 293 (H) 01/26/2020 1312   CHOL 218 07/18/2011 1245   TRIG 74 09/17/2021 0553   TRIG 112 07/18/2011 1245   HDL 43 09/17/2021 0553   HDL 51 01/26/2020 1312   CHOLHDL 5.0 09/17/2021 0553   VLDL 15 09/17/2021 0553   LDLCALC 157 (H) 09/17/2021 0553   LDLCALC 218 (H) 01/26/2020 1312    Other Studies Reviewed Today: Echo in 01/2022 LVEF 55-60%  Echo in 08/2021 LVEF 35%  LHC in 08/2021   Mid LAD lesion is 50% stenosed.   There is moderate to severe left ventricular systolic dysfunction.   LV end diastolic pressure is moderately elevated.   The left ventricular ejection fraction is 25-35% by visual estimate.  Assessment and Plan:  #HF improved EF (Takotsubo cardiomyopathy in May 2023 with a EF 30 to 35% improved to 55 to 60% in September 2023), currently compensated Plan - Compensated, continue carvedilol  6.25 mg twice daily and Entresto  24-26 mg twice daily.  Did not tolerate Farxiga  due to UTI/yeast infections.  Serum creatinine normal.  EKG today showed NSR, first degree AV block.  If she experiences any dizziness/lightheadedness, will need to cut back on the carvedilol  dose.  #Moderate CAD (mid LAD 50% stenosis), angina free Plan -No interval angina.  She did have some chest pains lasting for a few seconds when she was pushing the furniture but no angina. -Continue aspirin  81 mg once daily, atorvastatin  40 mg nightly.  #HLD, not on goal (goal less than 100) Plan - Continue atorvastatin  40 mg nightly. Goal LDL less than 100.  No recent lipid  panel, can follow-up with PCP for lipid panel.  Refilled medications.  Medication Adjustments/Labs and Tests Ordered: Current medicines are reviewed at length with the patient today.  Concerns regarding medicines are outlined above.   Tests Ordered: Orders Placed This Encounter  Procedures   EKG 12-Lead    Medication Changes: Meds ordered this encounter  Medications   atorvastatin  (LIPITOR) 40 MG tablet    Sig: Take 1 tablet (40 mg total) by mouth daily at 6 PM.    Dispense:  90 tablet    Refill:  3   carvedilol  (COREG ) 6.25 MG tablet    Sig: Take 1 tablet (6.25 mg total) by mouth 2 (two) times daily.    Dispense:  180 tablet    Refill:  3    01/29/2023 dose increase   sacubitril-valsartan (ENTRESTO ) 24-26 MG    Sig: Take 1 tablet by mouth 2 (two) times daily.  Dispense:  60 tablet    Refill:  5    Disposition:  Follow up 2 years  Signed Keene Gilkey Beauford Bounds, MD, 09/18/2023 4:23 PM    [facility] [address] [place], [place], [place] [address]

## 2024-02-28 ENCOUNTER — Other Ambulatory Visit: Payer: Self-pay | Admitting: Medical Genetics

## 2024-02-28 DIAGNOSIS — Z006 Encounter for examination for normal comparison and control in clinical research program: Secondary | ICD-10-CM

## 2024-03-30 ENCOUNTER — Telehealth: Payer: Self-pay | Admitting: Student

## 2024-04-03 NOTE — Telephone Encounter (Signed)
*  STAT* If patient is at the pharmacy, call can be transferred to refill team.   1. Which medications need to be refilled? (please list name of each medication and dose if known)   pantoprazole  (PROTONIX ) 40 MG tablet   2. Would you like to learn more about the convenience, safety, & potential cost savings by using the [facility]?   3. Are you open to using the [facility] (Type [facility]. ).  4. Which pharmacy/location (including street and city if local pharmacy) is medication to be sent to?  [facility] [place], [place] - [address]   5. Do they need a 30 day or 90 day supply?   90 days   Caller (Stu) stated patient is completely out of this medication.

## 2024-04-03 NOTE — Telephone Encounter (Signed)
 Pt's medication was sent to pt's pharmacy as requested. Confirmation received.
